# Patient Record
Sex: Female | Born: 1950 | Race: Black or African American | Hispanic: No | Marital: Single | Smoking: Current every day smoker
Health system: Southern US, Community
[De-identification: ages and names within clinical notes are randomized; demographics above are authoritative.]

## PROBLEM LIST (undated history)

## (undated) DIAGNOSIS — E785 Hyperlipidemia, unspecified: Secondary | ICD-10-CM

## (undated) DIAGNOSIS — I2510 Atherosclerotic heart disease of native coronary artery without angina pectoris: Secondary | ICD-10-CM

## (undated) DIAGNOSIS — I5022 Chronic systolic (congestive) heart failure: Secondary | ICD-10-CM

## (undated) DIAGNOSIS — I10 Essential (primary) hypertension: Secondary | ICD-10-CM

## (undated) HISTORY — DX: Essential (primary) hypertension: I10

## (undated) HISTORY — DX: Atherosclerotic heart disease of native coronary artery without angina pectoris: I25.10

## (undated) HISTORY — DX: Chronic systolic (congestive) heart failure: I50.22

## (undated) HISTORY — DX: Hyperlipidemia, unspecified: E78.5

---

## 1971-02-10 HISTORY — PX: ABDOMINAL HYSTERECTOMY: SHX81

## 2000-01-28 ENCOUNTER — Emergency Department (HOSPITAL_COMMUNITY): Admission: EM | Admit: 2000-01-28 | Discharge: 2000-01-28 | Payer: Self-pay | Admitting: Emergency Medicine

## 2000-02-13 ENCOUNTER — Emergency Department (HOSPITAL_COMMUNITY): Admission: EM | Admit: 2000-02-13 | Discharge: 2000-02-14 | Payer: Self-pay | Admitting: Emergency Medicine

## 2000-07-20 ENCOUNTER — Emergency Department (HOSPITAL_COMMUNITY): Admission: EM | Admit: 2000-07-20 | Discharge: 2000-07-20 | Payer: Self-pay | Admitting: Emergency Medicine

## 2001-01-18 ENCOUNTER — Encounter: Payer: Self-pay | Admitting: Emergency Medicine

## 2001-01-18 ENCOUNTER — Emergency Department (HOSPITAL_COMMUNITY): Admission: EM | Admit: 2001-01-18 | Discharge: 2001-01-19 | Payer: Self-pay | Admitting: Emergency Medicine

## 2004-02-15 ENCOUNTER — Emergency Department (HOSPITAL_COMMUNITY): Admission: EM | Admit: 2004-02-15 | Discharge: 2004-02-15 | Payer: Self-pay | Admitting: Family Medicine

## 2004-07-11 ENCOUNTER — Emergency Department (HOSPITAL_COMMUNITY): Admission: EM | Admit: 2004-07-11 | Discharge: 2004-07-11 | Payer: Self-pay | Admitting: Emergency Medicine

## 2012-07-08 ENCOUNTER — Emergency Department (HOSPITAL_COMMUNITY)
Admission: EM | Admit: 2012-07-08 | Discharge: 2012-07-08 | Disposition: A | Discharge status: Home / Self Care | Payer: No Typology Code available for payment source | Attending: Emergency Medicine | Admitting: Emergency Medicine

## 2012-07-08 ENCOUNTER — Encounter (HOSPITAL_COMMUNITY): Payer: Self-pay | Admitting: *Deleted

## 2012-07-08 ENCOUNTER — Emergency Department (HOSPITAL_COMMUNITY): Payer: No Typology Code available for payment source

## 2012-07-08 DIAGNOSIS — Y9229 Other specified public building as the place of occurrence of the external cause: Secondary | ICD-10-CM | POA: Insufficient documentation

## 2012-07-08 DIAGNOSIS — F17200 Nicotine dependence, unspecified, uncomplicated: Secondary | ICD-10-CM | POA: Insufficient documentation

## 2012-07-08 DIAGNOSIS — M25562 Pain in left knee: Secondary | ICD-10-CM

## 2012-07-08 DIAGNOSIS — W010XXA Fall on same level from slipping, tripping and stumbling without subsequent striking against object, initial encounter: Secondary | ICD-10-CM | POA: Insufficient documentation

## 2012-07-08 DIAGNOSIS — Y998 Other external cause status: Secondary | ICD-10-CM | POA: Insufficient documentation

## 2012-07-08 DIAGNOSIS — M25569 Pain in unspecified knee: Secondary | ICD-10-CM | POA: Insufficient documentation

## 2012-07-08 MED ORDER — HYDROCODONE-ACETAMINOPHEN 5-325 MG PO TABS: 1.0000 | ORAL_TABLET | ORAL | Status: DC | PRN

## 2012-07-08 NOTE — ED Notes (Signed)
The pt slipped in some water at a store and twisted her lt  Knee.  She was mopping the floor when she went home and felt a twinge in her lt side

## 2012-07-08 NOTE — ED Provider Notes (Signed)
History  This chart was scribed for Arnoldo Hooker, PA-C working with Dione Booze, MD by Ardelia Mems, ED Scribe. This patient was seen in room TR08C/TR08C and the patient's care was started at 8:22 PM.   CSN: [id number]  Arrival date & time 07/08/12  1845     Chief Complaint  Patient presents with  . Fall     The history is provided by the patient. No language interpreter was used.    HPI Comments: Rebecca Esparza is a 62 y.o. female who presents to the Emergency Department complaining of constant, moderate left knee pain onset last night after a fall at a store. Pt states that she slipped on some water at a store, twisted and fell onto her knee. She states that she was mopping the floor at her house afterwards when she felt a twinge in her knee. Pt is ambulatory. Pt states that she is otherwise healthy.   History reviewed. No pertinent past medical history.  History reviewed. No pertinent past surgical history.  No family history on file.  History  Substance Use Topics  . Smoking status: Current Every Day Smoker  . Smokeless tobacco: Not on file  . Alcohol Use: Yes    OB History   Grav Para Term Preterm Abortions TAB SAB Ect Mult Living                  Review of Systems  Constitutional: Negative for fever and chills.  Gastrointestinal: Negative for vomiting, abdominal pain and diarrhea.  Musculoskeletal: Negative for back pain.       See HPI.  Neurological: Negative for headaches.    Allergies  Review of patient's allergies indicates no known allergies.  Home Medications  No current outpatient prescriptions on file.  Triage Vitals: BP 135/102  Pulse 95  Temp(Src) 98.3 F (36.8 C)  Resp 20  SpO2 96%  Physical Exam  Nursing note and vitals reviewed. Constitutional: She is oriented to person, place, and time. She appears well-developed and well-nourished.  HENT:  Head: Normocephalic and atraumatic.  Eyes: EOM are normal. Pupils are equal, round,  and reactive to light.  Neck: Normal range of motion. No tracheal deviation present.  Cardiovascular: Normal rate.   Pulmonary/Chest: Effort normal. No respiratory distress.  Abdominal: Soft. There is no tenderness.  Musculoskeletal:  Minimal swelliing to the left knee. No bony deformities. Joint stable. Anterior tenderness. No calf or thigh pain. Distal pulses intact.  Neurological: She is alert and oriented to person, place, and time.  Skin: Skin is warm. No rash noted.  Psychiatric: She has a normal mood and affect.    ED Course  Procedures (including critical care time)  DIAGNOSTIC STUDIES: Oxygen Saturation is 96% on RA, normal by my interpretation.    COORDINATION OF CARE: 8:30 PM- Pt advised of plan for treatment and pt agrees.     Labs Reviewed - No data to display No results found.   No diagnosis found.  1. Knee pain   MDM  Negative x-rays and stable joint. Doubt ligamentous rupture injury. Consistent with strain injury. Knee immobilizer. Patient declines crutches.         I personally performed the services described in this documentation, which was scribed in my presence. The recorded information has been reviewed and is accurate.     Arnoldo Hooker, PA-C 07/08/12 2059

## 2012-07-09 NOTE — ED Provider Notes (Signed)
Medical screening examination/treatment/procedure(s) were performed by non-physician practitioner and as supervising physician I was immediately available for consultation/collaboration.  Dione Booze, MD 07/09/12 [phone]

## 2013-09-30 IMAGING — CR DG KNEE COMPLETE 4+V*L*
4 series · 4 of 4 positions shown · non-contrast
Comparison: None.

CLINICAL DATA: Fall, left knee pain

LEFT KNEE - COMPLETE 4+ VIEW

[t knee ap left]
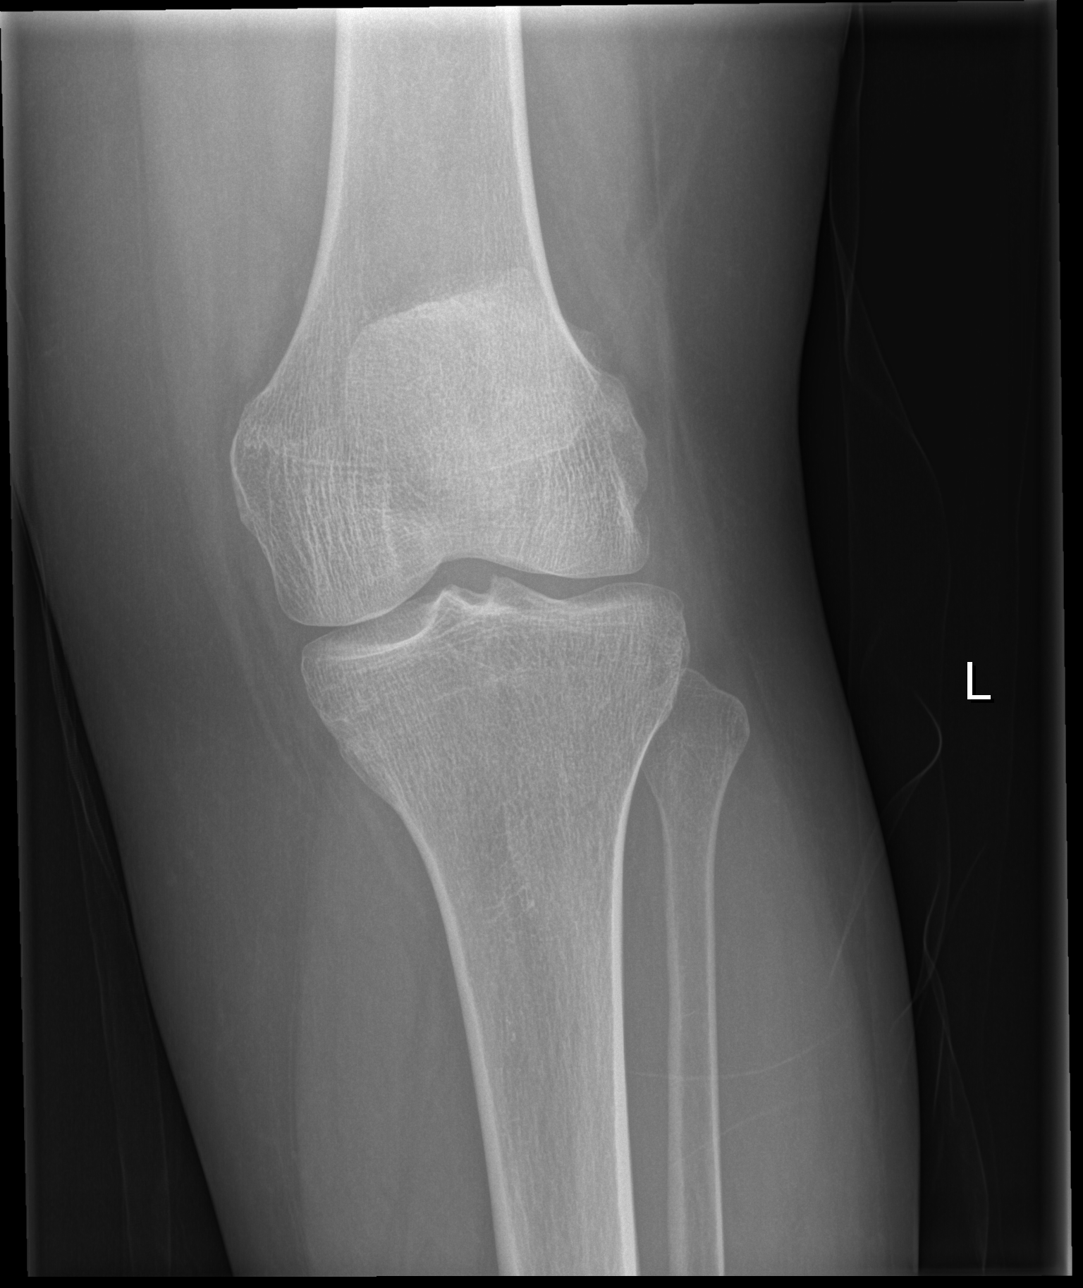

[t knee obl left (1 of 2)]
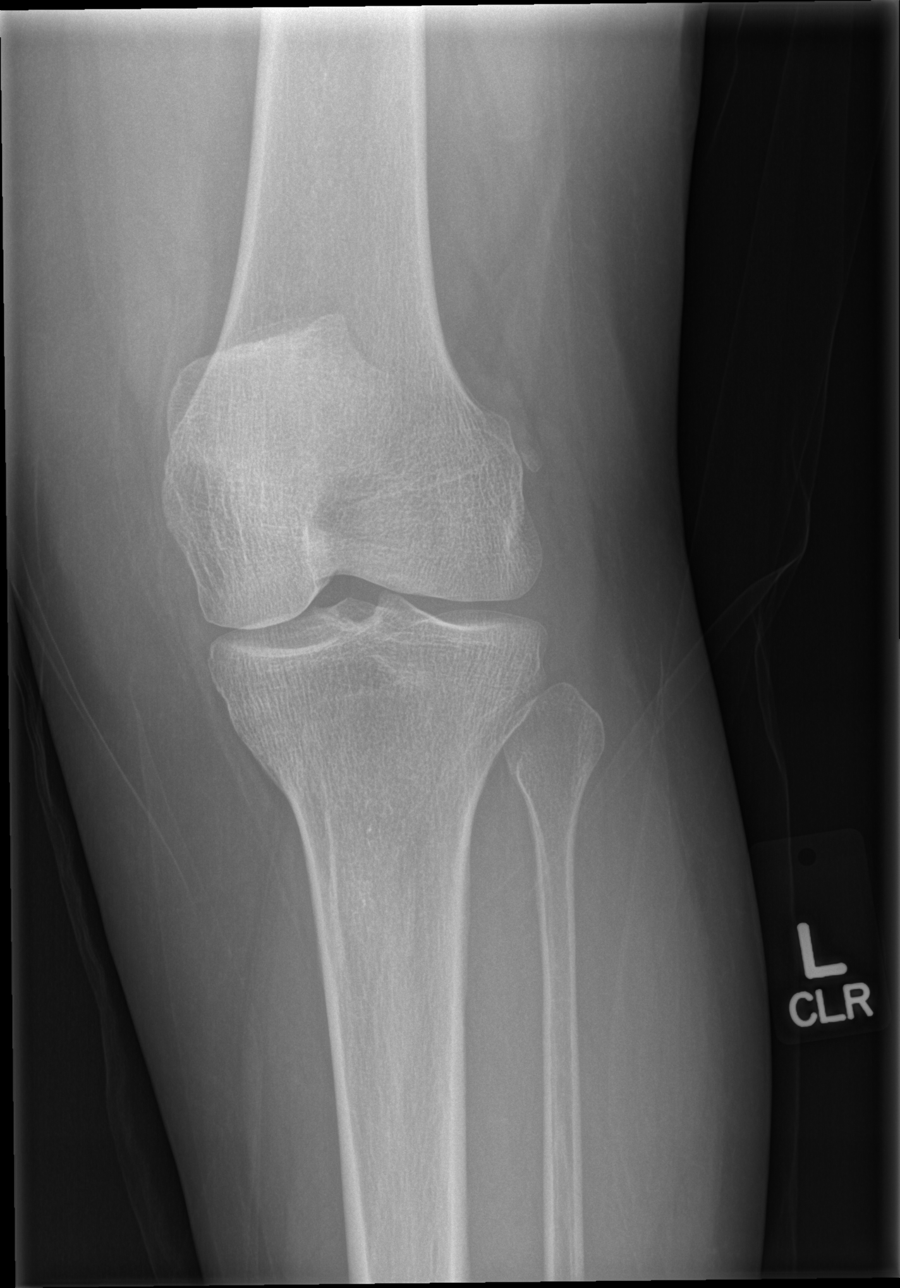

[t knee obl left (2 of 2)]
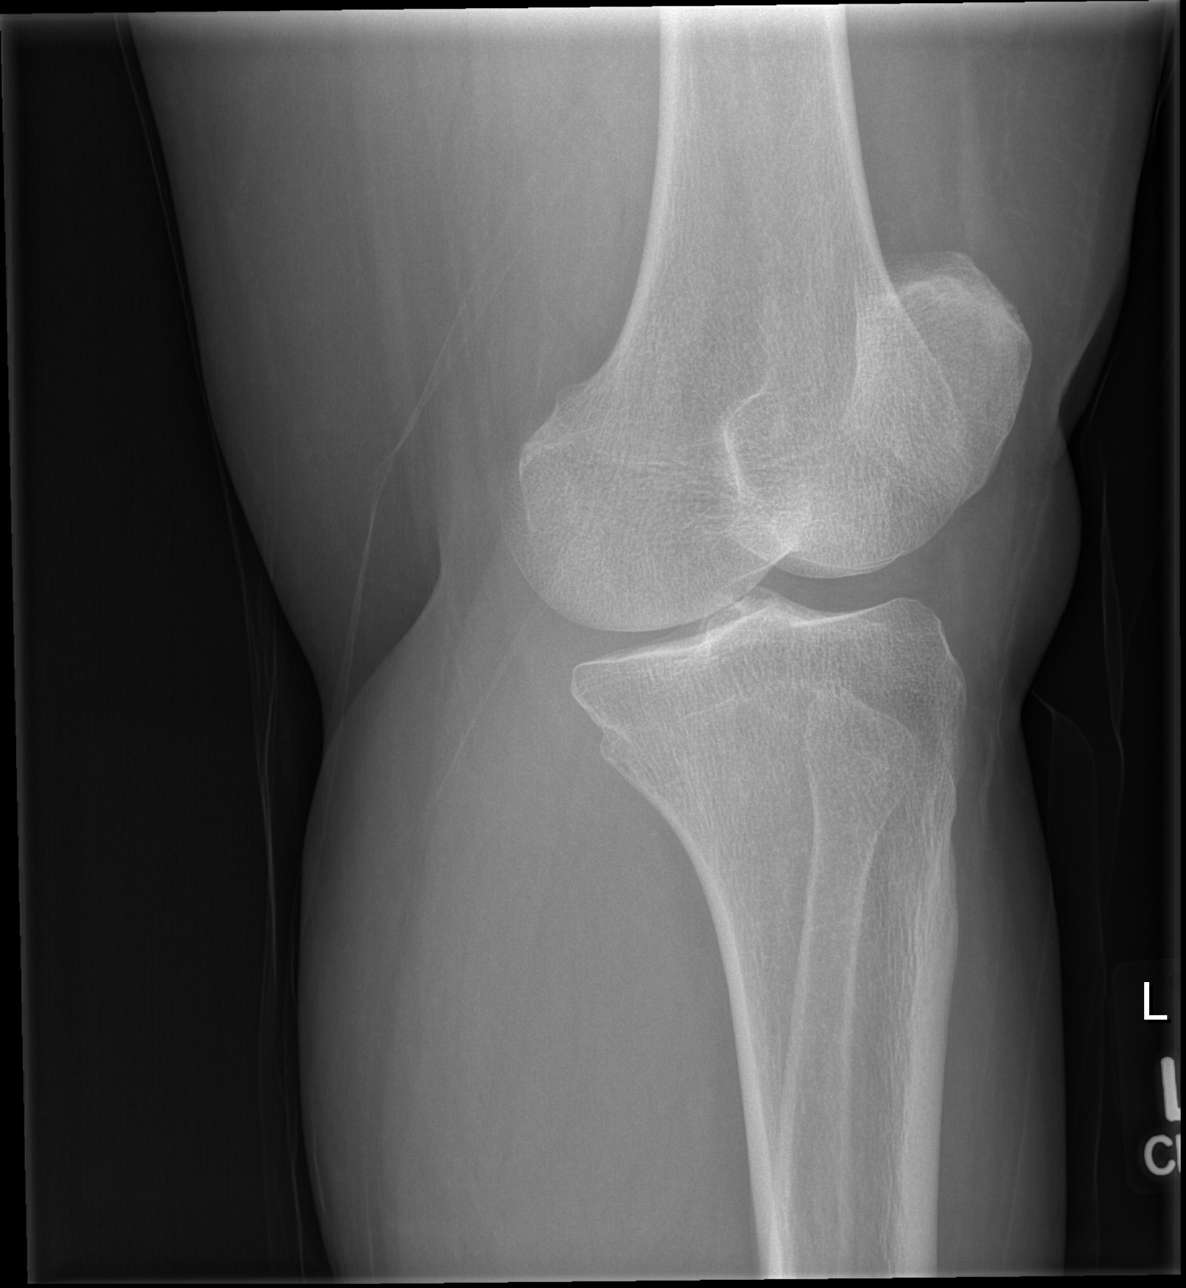

[t knee lat left]
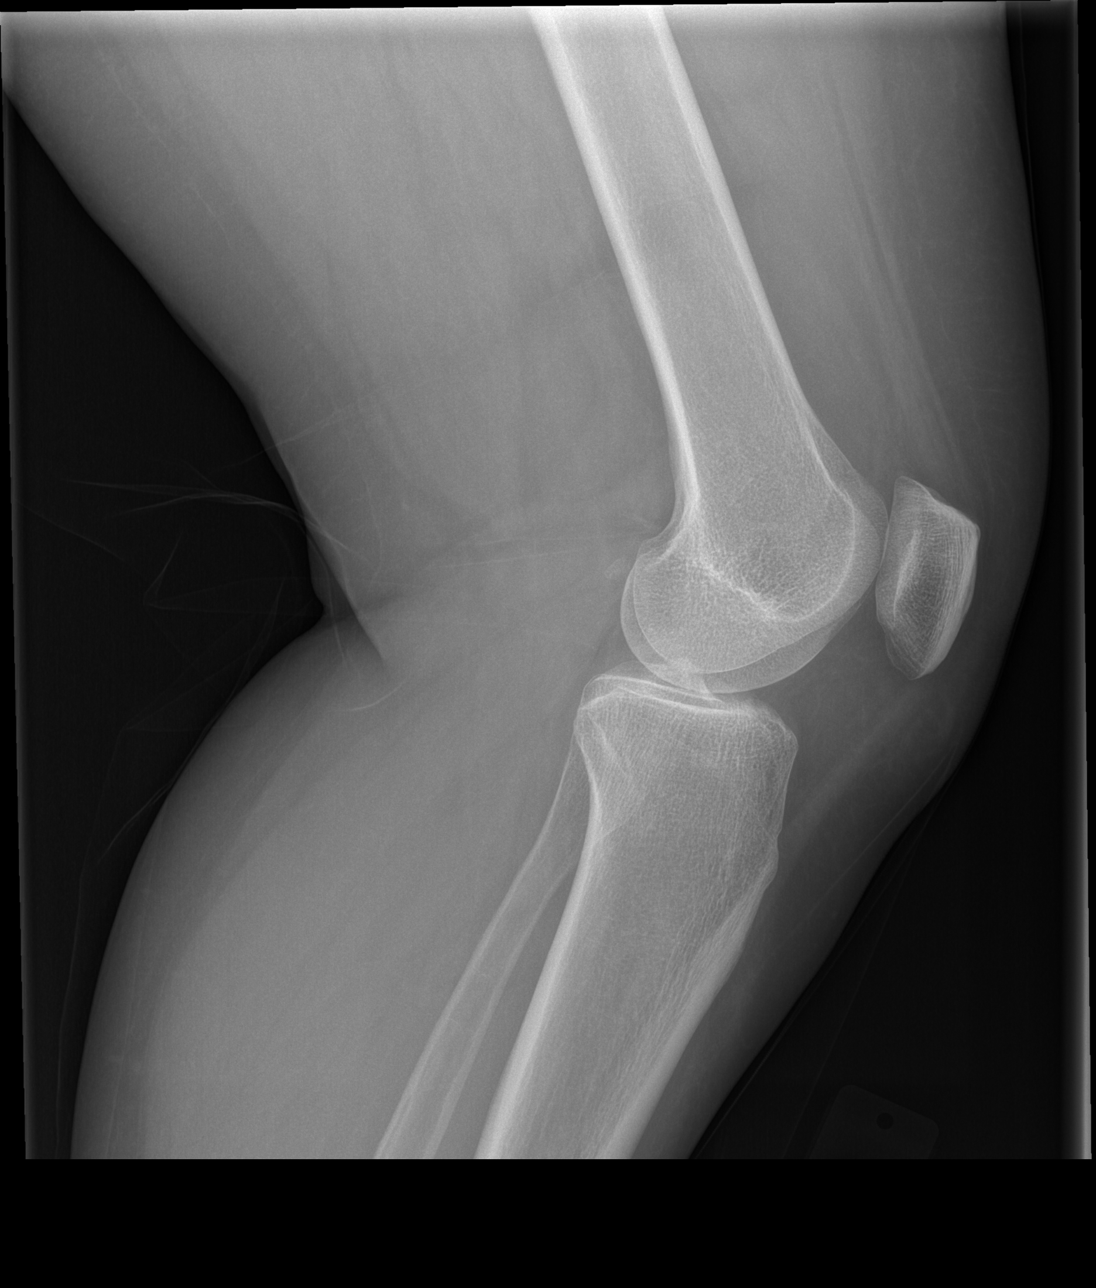

[4 of 4 positions shown; findings below may reference images not displayed]

FINDINGS: No fracture or dislocation is seen.

The joint spaces are preserved.

Moderate suprapatellar knee joint effusion.
IMPRESSION: No fracture or dislocation is seen.

Moderate suprapatellar knee joint effusion.

## 2019-05-10 ENCOUNTER — Encounter (HOSPITAL_COMMUNITY): Payer: Self-pay

## 2019-05-10 ENCOUNTER — Ambulatory Visit (HOSPITAL_COMMUNITY)
Admission: EM | Admit: 2019-05-10 | Discharge: 2019-05-10 | Disposition: A | Discharge status: Home / Self Care | Payer: Medicare Other | Attending: Family Medicine | Admitting: Family Medicine

## 2019-05-10 ENCOUNTER — Other Ambulatory Visit: Payer: Self-pay

## 2019-05-10 DIAGNOSIS — R030 Elevated blood-pressure reading, without diagnosis of hypertension: Secondary | ICD-10-CM

## 2019-05-10 DIAGNOSIS — F439 Reaction to severe stress, unspecified: Secondary | ICD-10-CM

## 2019-05-10 NOTE — ED Triage Notes (Signed)
Patient went to have her tooth pulled and the dentist send her here due to HTN. According to the dentist, Bps were as follows:  0920: 175/172, P 118 0935: 186/142, P 112 0945: 157/132, P 111

## 2019-05-10 NOTE — ED Provider Notes (Signed)
[place]   [id number] 05/10/19 Arrival Time: 4008  ASSESSMENT & PLAN:  1. Elevated blood pressure reading without diagnosis of hypertension   2. Situational stress     Declines initiation of BP therapy at this time. Recommend checking pressures and arranging f/u with PCP. May f/u here at any time to recheck BP. No s/s of hypertensive urgency.   Follow-up Information    Schedule an appointment as soon as possible for a visit  with Primary Care at [facility].   Specialty: Family Medicine Contact information: [address] [place] [phone]       Schedule an appointment as soon as possible for a visit  with [place].   Contact information: [address] [place] [phone]         See AVS for written HTN information.  Reviewed expectations re: course of current medical issues. Questions answered. Outlined signs and symptoms indicating need for more acute intervention. Patient verbalized understanding. After Visit Summary given.   SUBJECTIVE:  Rebecca Esparza is a 69 y.o. female who presents with concerns regarding increased blood pressures noted at the dentist this morning. "I'm really stressed too"; family matters; letting her brother stay with her currently. She reports that she has not been treated for hypertension in the past.  She reports no chest pain on exertion, no dyspnea on exertion, no swelling of ankles, no orthostatic dizziness or lightheadedness and no orthopnea or paroxysmal nocturnal dyspnea.  Denies symptoms of chest pain, palpations, orthopnea, nocturnal dyspnea, or LE edema.  Social History   Tobacco Use  Smoking Status Current Every Day Smoker  Smokeless Tobacco Never Used  Tobacco Comment   5-7 cigs/day    OBJECTIVE:  Vitals:   05/10/19 1027  BP: (!) 145/108  Pulse: (!) 123  Resp: 16  Temp: 99 F (37.2 C)  TempSrc:  Oral  SpO2: 98%    General appearance: alert; no distress Eyes: PERRLA; EOMI HENT: normocephalic; atraumatic Neck: supple Lungs: speaks full sentences without difficulty; unlabored CV: slight tachycardia (recheck pulse 110 and reg) Abdomen: soft, non-tender; bowel sounds normal Extremities: no edema; symmetrical with no gross deformities Skin: warm and dry Psychological: alert and cooperative; normal mood and affect    No Known Allergies  History reviewed. No pertinent past medical history.   Social History   Socioeconomic History  . Marital status: Single    Spouse name: Not on file  . Number of children: Not on file  . Years of education: Not on file  . Highest education level: Not on file  Occupational History  . Not on file  Tobacco Use  . Smoking status: Current Every Day Smoker  . Smokeless tobacco: Never Used  . Tobacco comment: 5-7 cigs/day  Substance and Sexual Activity  . Alcohol use: Not Currently  . Drug use: Not on file  . Sexual activity: Not on file  Other Topics Concern  . Not on file  Social History Narrative  . Not on file   Social Determinants of Health   Financial Resource Strain:   . Difficulty of Paying Living Expenses:   Food Insecurity:   . Worried About Charity fundraiser in the Last Year:   . Arboriculturist in the Last Year:   Transportation Needs:   . Film/video editor (Medical):   Marland Kitchen Lack of Transportation (Non-Medical):   Physical Activity:   . Days of Exercise per  Week:   . Minutes of Exercise per Session:   Stress:   . Feeling of Stress :   Social Connections:   . Frequency of Communication with Friends and Family:   . Frequency of Social Gatherings with Friends and Family:   . Attends Religious Services:   . Active Member of Clubs or Organizations:   . Attends Banker Meetings:   Marland Kitchen Marital Status:   Intimate Partner Violence:   . Fear of Current or Ex-Partner:   . Emotionally Abused:   Marland Kitchen Physically  Abused:   . Sexually Abused:    No family history on file. Past Surgical History:  Procedure Laterality Date  . ABDOMINAL HYSTERECTOMY  1973      Mardella Layman, MD 05/10/19 1058

## 2022-03-12 DIAGNOSIS — I2102 ST elevation (STEMI) myocardial infarction involving left anterior descending coronary artery: Secondary | ICD-10-CM

## 2022-03-12 HISTORY — DX: ST elevation (STEMI) myocardial infarction involving left anterior descending coronary artery: I21.02

## 2022-03-20 ENCOUNTER — Encounter (HOSPITAL_COMMUNITY): Payer: Self-pay | Admitting: Emergency Medicine

## 2022-03-20 ENCOUNTER — Encounter (HOSPITAL_COMMUNITY)
Admission: EM | Disposition: A | Discharge status: Home / Self Care | Payer: Self-pay | Source: Home / Self Care | Attending: Cardiovascular Disease

## 2022-03-20 ENCOUNTER — Other Ambulatory Visit: Payer: Self-pay

## 2022-03-20 ENCOUNTER — Emergency Department (HOSPITAL_COMMUNITY): Payer: 59

## 2022-03-20 ENCOUNTER — Inpatient Hospital Stay (HOSPITAL_COMMUNITY)
Admission: EM | Admit: 2022-03-20 | Discharge: 2022-03-28 | DRG: 270 | Disposition: A | Discharge status: Home / Self Care | Payer: 59 | Attending: Cardiovascular Disease | Admitting: Cardiovascular Disease

## 2022-03-20 DIAGNOSIS — R42 Dizziness and giddiness: Secondary | ICD-10-CM | POA: Diagnosis not present

## 2022-03-20 DIAGNOSIS — Z6838 Body mass index (BMI) 38.0-38.9, adult: Secondary | ICD-10-CM

## 2022-03-20 DIAGNOSIS — J9601 Acute respiratory failure with hypoxia: Secondary | ICD-10-CM | POA: Diagnosis not present

## 2022-03-20 DIAGNOSIS — R0689 Other abnormalities of breathing: Secondary | ICD-10-CM | POA: Diagnosis not present

## 2022-03-20 DIAGNOSIS — I5021 Acute systolic (congestive) heart failure: Secondary | ICD-10-CM | POA: Diagnosis not present

## 2022-03-20 DIAGNOSIS — Z79899 Other long term (current) drug therapy: Secondary | ICD-10-CM | POA: Diagnosis not present

## 2022-03-20 DIAGNOSIS — I509 Heart failure, unspecified: Secondary | ICD-10-CM | POA: Diagnosis not present

## 2022-03-20 DIAGNOSIS — J42 Unspecified chronic bronchitis: Secondary | ICD-10-CM | POA: Diagnosis not present

## 2022-03-20 DIAGNOSIS — I110 Hypertensive heart disease with heart failure: Secondary | ICD-10-CM | POA: Diagnosis present

## 2022-03-20 DIAGNOSIS — I2109 ST elevation (STEMI) myocardial infarction involving other coronary artery of anterior wall: Principal | ICD-10-CM | POA: Diagnosis present

## 2022-03-20 DIAGNOSIS — Z1152 Encounter for screening for COVID-19: Secondary | ICD-10-CM

## 2022-03-20 DIAGNOSIS — K047 Periapical abscess without sinus: Secondary | ICD-10-CM | POA: Diagnosis not present

## 2022-03-20 DIAGNOSIS — F17210 Nicotine dependence, cigarettes, uncomplicated: Secondary | ICD-10-CM | POA: Diagnosis present

## 2022-03-20 DIAGNOSIS — N179 Acute kidney failure, unspecified: Secondary | ICD-10-CM | POA: Diagnosis present

## 2022-03-20 DIAGNOSIS — Z95828 Presence of other vascular implants and grafts: Secondary | ICD-10-CM | POA: Diagnosis not present

## 2022-03-20 DIAGNOSIS — I213 ST elevation (STEMI) myocardial infarction of unspecified site: Principal | ICD-10-CM

## 2022-03-20 DIAGNOSIS — J984 Other disorders of lung: Secondary | ICD-10-CM | POA: Diagnosis not present

## 2022-03-20 DIAGNOSIS — Z720 Tobacco use: Secondary | ICD-10-CM | POA: Diagnosis not present

## 2022-03-20 DIAGNOSIS — Z452 Encounter for adjustment and management of vascular access device: Secondary | ICD-10-CM | POA: Diagnosis not present

## 2022-03-20 DIAGNOSIS — R053 Chronic cough: Secondary | ICD-10-CM | POA: Diagnosis present

## 2022-03-20 DIAGNOSIS — I255 Ischemic cardiomyopathy: Secondary | ICD-10-CM | POA: Diagnosis not present

## 2022-03-20 DIAGNOSIS — R000 Tachycardia, unspecified: Secondary | ICD-10-CM | POA: Diagnosis not present

## 2022-03-20 DIAGNOSIS — I2102 ST elevation (STEMI) myocardial infarction involving left anterior descending coronary artery: Secondary | ICD-10-CM | POA: Diagnosis not present

## 2022-03-20 DIAGNOSIS — I2510 Atherosclerotic heart disease of native coronary artery without angina pectoris: Secondary | ICD-10-CM | POA: Diagnosis not present

## 2022-03-20 DIAGNOSIS — R739 Hyperglycemia, unspecified: Secondary | ICD-10-CM | POA: Diagnosis present

## 2022-03-20 DIAGNOSIS — R570 Cardiogenic shock: Secondary | ICD-10-CM | POA: Diagnosis not present

## 2022-03-20 DIAGNOSIS — Z90710 Acquired absence of both cervix and uterus: Secondary | ICD-10-CM | POA: Diagnosis not present

## 2022-03-20 DIAGNOSIS — E669 Obesity, unspecified: Secondary | ICD-10-CM | POA: Diagnosis present

## 2022-03-20 DIAGNOSIS — Z955 Presence of coronary angioplasty implant and graft: Secondary | ICD-10-CM

## 2022-03-20 DIAGNOSIS — I517 Cardiomegaly: Secondary | ICD-10-CM | POA: Diagnosis not present

## 2022-03-20 DIAGNOSIS — I420 Dilated cardiomyopathy: Secondary | ICD-10-CM | POA: Diagnosis not present

## 2022-03-20 DIAGNOSIS — I700 Atherosclerosis of aorta: Secondary | ICD-10-CM | POA: Diagnosis not present

## 2022-03-20 DIAGNOSIS — R0902 Hypoxemia: Secondary | ICD-10-CM | POA: Diagnosis not present

## 2022-03-20 DIAGNOSIS — R0602 Shortness of breath: Secondary | ICD-10-CM | POA: Diagnosis not present

## 2022-03-20 DIAGNOSIS — I771 Stricture of artery: Secondary | ICD-10-CM | POA: Diagnosis not present

## 2022-03-20 HISTORY — PX: IABP INSERTION: CATH118242

## 2022-03-20 HISTORY — PX: RIGHT/LEFT HEART CATH AND CORONARY ANGIOGRAPHY: CATH118266

## 2022-03-20 HISTORY — PX: CORONARY/GRAFT ACUTE MI REVASCULARIZATION: CATH118305

## 2022-03-20 LAB — CBC WITH DIFFERENTIAL/PLATELET
Abs Immature Granulocytes: 0 10*3/uL (ref 0.00–0.07)
Basophils Absolute: 0 10*3/uL (ref 0.0–0.1)
Basophils Relative: 0 %
Eosinophils Absolute: 0.4 10*3/uL (ref 0.0–0.5)
Eosinophils Relative: 4 %
HCT: 36.5 % (ref 36.0–46.0)
Hemoglobin: 11.8 g/dL — ABNORMAL LOW (ref 12.0–15.0)
Lymphocytes Relative: 56 %
Lymphs Abs: 5.5 10*3/uL — ABNORMAL HIGH (ref 0.7–4.0)
MCH: 32.2 pg (ref 26.0–34.0)
MCHC: 32.3 g/dL (ref 30.0–36.0)
MCV: 99.7 fL (ref 80.0–100.0)
Monocytes Absolute: 0.4 10*3/uL (ref 0.1–1.0)
Monocytes Relative: 4 %
Neutro Abs: 3.6 10*3/uL (ref 1.7–7.7)
Neutrophils Relative %: 36 %
Platelets: 278 10*3/uL (ref 150–400)
RBC: 3.66 MIL/uL — ABNORMAL LOW (ref 3.87–5.11)
RDW: 13.7 % (ref 11.5–15.5)
WBC: 9.9 10*3/uL (ref 4.0–10.5)
nRBC: 0 % (ref 0.0–0.2)
nRBC: 0 /100 WBC

## 2022-03-20 LAB — COMPREHENSIVE METABOLIC PANEL
ALT: 35 U/L (ref 0–44)
AST: 53 U/L — ABNORMAL HIGH (ref 15–41)
Albumin: 3.3 g/dL — ABNORMAL LOW (ref 3.5–5.0)
Alkaline Phosphatase: 89 U/L (ref 38–126)
Anion gap: 16 — ABNORMAL HIGH (ref 5–15)
BUN: 12 mg/dL (ref 8–23)
CO2: 15 mmol/L — ABNORMAL LOW (ref 22–32)
Calcium: 8.6 mg/dL — ABNORMAL LOW (ref 8.9–10.3)
Chloride: 105 mmol/L (ref 98–111)
Creatinine, Ser: 1.48 mg/dL — ABNORMAL HIGH (ref 0.44–1.00)
GFR, Estimated: 38 mL/min — ABNORMAL LOW (ref >60)
Glucose, Bld: 475 mg/dL — ABNORMAL HIGH (ref 70–99)
Potassium: 4.1 mmol/L (ref 3.5–5.1)
Sodium: 136 mmol/L (ref 135–145)
Total Bilirubin: 1 mg/dL (ref 0.3–1.2)
Total Protein: 6.6 g/dL (ref 6.5–8.1)

## 2022-03-20 LAB — POCT I-STAT EG7
Acid-base deficit: 2 mmol/L (ref 0.0–2.0)
Bicarbonate: 25 mmol/L (ref 20.0–28.0)
Calcium, Ion: 1.22 mmol/L (ref 1.15–1.40)
HCT: 35 % — ABNORMAL LOW (ref 36.0–46.0)
Hemoglobin: 11.9 g/dL — ABNORMAL LOW (ref 12.0–15.0)
O2 Saturation: 63 %
Potassium: 3.8 mmol/L (ref 3.5–5.1)
Sodium: 141 mmol/L (ref 135–145)
TCO2: 27 mmol/L (ref 22–32)
pCO2, Ven: 50.8 mmHg (ref 44–60)
pH, Ven: 7.301 (ref 7.25–7.43)
pO2, Ven: 37 mmHg (ref 32–45)

## 2022-03-20 LAB — POCT ACTIVATED CLOTTING TIME
Activated Clotting Time: 255 seconds
Activated Clotting Time: 390 seconds
Activated Clotting Time: 287 seconds

## 2022-03-20 LAB — BRAIN NATRIURETIC PEPTIDE: B Natriuretic Peptide: 2318.7 pg/mL — ABNORMAL HIGH (ref 0.0–100.0)

## 2022-03-20 LAB — TROPONIN I (HIGH SENSITIVITY): Troponin I (High Sensitivity): 1090 ng/L (ref <18)

## 2022-03-20 LAB — GLUCOSE, CAPILLARY: Glucose-Capillary: 139 mg/dL — ABNORMAL HIGH (ref 70–99)

## 2022-03-20 SURGERY — CORONARY/GRAFT ACUTE MI REVASCULARIZATION
Anesthesia: LOCAL

## 2022-03-20 MED ORDER — TICAGRELOR 90 MG PO TABS
ORAL_TABLET | ORAL | Status: AC
  Filled 2022-03-20: qty 2

## 2022-03-20 MED ORDER — FENTANYL CITRATE PF 50 MCG/ML IJ SOSY
50.0000 ug | PREFILLED_SYRINGE | Freq: Once | INTRAMUSCULAR | Status: AC
  Administered 2022-03-20: 50 ug via INTRAVENOUS
  Filled 2022-03-20: qty 1

## 2022-03-20 MED ORDER — LIDOCAINE HCL (PF) 1 % IJ SOLN
INTRAMUSCULAR | Status: DC | PRN
  Administered 2022-03-20 (×2): 2 mL
  Administered 2022-03-20: 10 mL

## 2022-03-20 MED ORDER — HEPARIN SODIUM (PORCINE) 1000 UNIT/ML IJ SOLN
INTRAMUSCULAR | Status: AC
  Filled 2022-03-20: qty 10

## 2022-03-20 MED ORDER — HEPARIN SODIUM (PORCINE) 1000 UNIT/ML IJ SOLN
INTRAMUSCULAR | Status: DC | PRN
  Administered 2022-03-20: 3000 [IU] via INTRAVENOUS
  Administered 2022-03-20: 5000 [IU] via INTRAVENOUS

## 2022-03-20 MED ORDER — LIDOCAINE HCL (PF) 1 % IJ SOLN
INTRAMUSCULAR | Status: AC
  Filled 2022-03-20: qty 30

## 2022-03-20 MED ORDER — FUROSEMIDE 10 MG/ML IJ SOLN
40.0000 mg | Freq: Two times a day (BID) | INTRAMUSCULAR | Status: DC
  Administered 2022-03-21: 40 mg via INTRAVENOUS
  Filled 2022-03-20: qty 4

## 2022-03-20 MED ORDER — HEPARIN (PORCINE) IN NACL 1000-0.9 UT/500ML-% IV SOLN
INTRAVENOUS | Status: AC
  Filled 2022-03-20: qty 500

## 2022-03-20 MED ORDER — ONDANSETRON HCL 4 MG/2ML IJ SOLN
4.0000 mg | Freq: Four times a day (QID) | INTRAMUSCULAR | Status: DC | PRN
  Administered 2022-03-22: 4 mg via INTRAVENOUS
  Filled 2022-03-20: qty 2

## 2022-03-20 MED ORDER — IOHEXOL 350 MG/ML SOLN
INTRAVENOUS | Status: DC | PRN
  Administered 2022-03-20: 150 mL

## 2022-03-20 MED ORDER — ASPIRIN 81 MG PO TBEC
81.0000 mg | DELAYED_RELEASE_TABLET | Freq: Every day | ORAL | Status: DC
  Administered 2022-03-21 – 2022-03-28 (×8): 81 mg via ORAL
  Filled 2022-03-20 (×8): qty 1

## 2022-03-20 MED ORDER — FENTANYL CITRATE (PF) 100 MCG/2ML IJ SOLN
INTRAMUSCULAR | Status: AC
  Filled 2022-03-20: qty 2

## 2022-03-20 MED ORDER — SODIUM CHLORIDE 0.9 % IV SOLN
INTRAVENOUS | Status: AC | PRN
  Administered 2022-03-20: 10 mL/h via INTRAVENOUS

## 2022-03-20 MED ORDER — ATORVASTATIN CALCIUM 80 MG PO TABS
80.0000 mg | ORAL_TABLET | Freq: Every day | ORAL | Status: DC
  Administered 2022-03-21 – 2022-03-28 (×8): 80 mg via ORAL
  Filled 2022-03-20 (×8): qty 1

## 2022-03-20 MED ORDER — FUROSEMIDE 10 MG/ML IJ SOLN
60.0000 mg | Freq: Once | INTRAMUSCULAR | Status: AC
  Administered 2022-03-20: 60 mg via INTRAVENOUS
  Filled 2022-03-20: qty 6

## 2022-03-20 MED ORDER — NITROGLYCERIN 1 MG/10 ML FOR IR/CATH LAB
INTRA_ARTERIAL | Status: AC
  Filled 2022-03-20: qty 10

## 2022-03-20 MED ORDER — ACETAMINOPHEN 325 MG PO TABS
650.0000 mg | ORAL_TABLET | ORAL | Status: DC | PRN
  Administered 2022-03-21: 650 mg via ORAL
  Filled 2022-03-20: qty 2

## 2022-03-20 MED ORDER — SODIUM CHLORIDE 0.9 % IV SOLN: INTRAVENOUS | Status: DC

## 2022-03-20 MED ORDER — SODIUM CHLORIDE 0.9% IV SOLUTION: INTRAVENOUS | Status: DC

## 2022-03-20 MED ORDER — ASPIRIN 81 MG PO CHEW
243.0000 mg | CHEWABLE_TABLET | Freq: Once | ORAL | Status: AC
  Administered 2022-03-20: 243 mg via ORAL

## 2022-03-20 MED ORDER — HEPARIN (PORCINE) IN NACL 1000-0.9 UT/500ML-% IV SOLN
INTRAVENOUS | Status: DC | PRN
  Administered 2022-03-20 (×3): 500 mL

## 2022-03-20 MED ORDER — TICAGRELOR 90 MG PO TABS
ORAL_TABLET | ORAL | Status: DC | PRN
  Administered 2022-03-20: 180 mg via ORAL

## 2022-03-20 MED ORDER — HEPARIN (PORCINE) IN NACL 1000-0.9 UT/500ML-% IV SOLN
INTRAVENOUS | Status: AC
  Filled 2022-03-20: qty 1000

## 2022-03-20 MED ORDER — SODIUM CHLORIDE 0.9 % IV SOLN: 250.0000 mL | INTRAVENOUS | Status: DC | PRN

## 2022-03-20 MED ORDER — VERAPAMIL HCL 2.5 MG/ML IV SOLN
INTRAVENOUS | Status: AC
  Filled 2022-03-20: qty 2

## 2022-03-20 MED ORDER — NITROGLYCERIN 1 MG/10 ML FOR IR/CATH LAB
INTRA_ARTERIAL | Status: DC | PRN
  Administered 2022-03-20: 100 ug via INTRACORONARY

## 2022-03-20 MED ORDER — MIDAZOLAM HCL 2 MG/2ML IJ SOLN
INTRAMUSCULAR | Status: DC | PRN
  Administered 2022-03-20 (×2): 1 mg via INTRAVENOUS

## 2022-03-20 MED ORDER — HYDRALAZINE HCL 20 MG/ML IJ SOLN: 10.0000 mg | INTRAMUSCULAR | Status: AC | PRN

## 2022-03-20 MED ORDER — HEPARIN SODIUM (PORCINE) 5000 UNIT/ML IJ SOLN
4000.0000 [IU] | Freq: Once | INTRAMUSCULAR | Status: AC
  Administered 2022-03-20: 4000 [IU] via INTRAVENOUS

## 2022-03-20 MED ORDER — SODIUM CHLORIDE 0.9% FLUSH
3.0000 mL | Freq: Two times a day (BID) | INTRAVENOUS | Status: DC
  Administered 2022-03-21 – 2022-03-27 (×13): 3 mL via INTRAVENOUS

## 2022-03-20 MED ORDER — NITROGLYCERIN IN D5W 200-5 MCG/ML-% IV SOLN
0.0000 ug/min | INTRAVENOUS | Status: DC
  Administered 2022-03-20: 5 ug/min via INTRAVENOUS
  Filled 2022-03-20: qty 250

## 2022-03-20 MED ORDER — SODIUM CHLORIDE 0.9 % IV SOLN
INTRAVENOUS | Status: DC
  Administered 2022-03-20: 10 mL via INTRAVENOUS

## 2022-03-20 MED ORDER — SODIUM CHLORIDE 0.9% FLUSH: 3.0000 mL | INTRAVENOUS | Status: DC | PRN

## 2022-03-20 MED ORDER — CHLORHEXIDINE GLUCONATE CLOTH 2 % EX PADS
6.0000 | MEDICATED_PAD | Freq: Every day | CUTANEOUS | Status: DC
  Administered 2022-03-21 – 2022-03-24 (×4): 6 via TOPICAL

## 2022-03-20 MED ORDER — NITROGLYCERIN 0.4 MG SL SUBL: 0.4000 mg | SUBLINGUAL_TABLET | SUBLINGUAL | Status: DC | PRN

## 2022-03-20 MED ORDER — TICAGRELOR 90 MG PO TABS
90.0000 mg | ORAL_TABLET | Freq: Two times a day (BID) | ORAL | Status: DC
  Administered 2022-03-21 – 2022-03-28 (×15): 90 mg via ORAL
  Filled 2022-03-20 (×15): qty 1

## 2022-03-20 MED ORDER — VERAPAMIL HCL 2.5 MG/ML IV SOLN
INTRAVENOUS | Status: DC | PRN
  Administered 2022-03-20: 10 mL via INTRA_ARTERIAL

## 2022-03-20 MED ORDER — ASPIRIN 81 MG PO CHEW
CHEWABLE_TABLET | ORAL | Status: AC
  Filled 2022-03-20: qty 4

## 2022-03-20 MED ORDER — SODIUM CHLORIDE 0.9% IV SOLUTION: INTRAVENOUS | Status: DC | PRN

## 2022-03-20 MED ORDER — MIDAZOLAM HCL 2 MG/2ML IJ SOLN
INTRAMUSCULAR | Status: AC
  Filled 2022-03-20: qty 2

## 2022-03-20 MED ORDER — LABETALOL HCL 5 MG/ML IV SOLN: 10.0000 mg | INTRAVENOUS | Status: AC | PRN

## 2022-03-20 SURGICAL SUPPLY — 30 items
BALL SAPPHIRE NC24 4.5X8 (BALLOONS) ×1
BALLN EMERGE MR 2.0X20 (BALLOONS) ×1
BALLN EMERGE MR 3.0X12 (BALLOONS) ×1
BALLN SAPPHIRE 2.0X12 (BALLOONS) ×1
BALLOON EMERGE MR 2.0X20 (BALLOONS) IMPLANT
BALLOON EMERGE MR 3.0X12 (BALLOONS) IMPLANT
BALLOON SAPPHIRE 2.0X12 (BALLOONS) IMPLANT
BALLOON SAPPHIRE NC24 4.5X8 (BALLOONS) IMPLANT
CATH 5FR JL3.5 JR4 ANG PIG MP (CATHETERS) IMPLANT
CATH IAB 7FR 34ML (CATHETERS) IMPLANT
CATH LAUNCHER 6FR EBU3.5 (CATHETERS) IMPLANT
CATH SWAN GANZ VIP 7.5F (CATHETERS) IMPLANT
DEVICE RAD COMP TR BAND LRG (VASCULAR PRODUCTS) IMPLANT
ELECT DEFIB PAD ADLT CADENCE (PAD) IMPLANT
GLIDESHEATH SLEND SS 6F .021 (SHEATH) IMPLANT
GUIDEWIRE INQWIRE 1.5J.035X260 (WIRE) IMPLANT
INQWIRE 1.5J .035X260CM (WIRE) ×1
KIT ENCORE 26 ADVANTAGE (KITS) IMPLANT
KIT HEART LEFT (KITS) ×1 IMPLANT
KIT HEMO VALVE WATCHDOG (MISCELLANEOUS) IMPLANT
KIT MICROPUNCTURE NIT STIFF (SHEATH) IMPLANT
PACK CARDIAC CATHETERIZATION (CUSTOM PROCEDURE TRAY) ×1 IMPLANT
SHEATH PINNACLE 8F 10CM (SHEATH) IMPLANT
SHEATH PROBE COVER 6X72 (BAG) IMPLANT
SLEEVE REPOSITIONING LENGTH 30 (MISCELLANEOUS) IMPLANT
STENT MEGATRON 4.0X12 (Permanent Stent) IMPLANT
TRANSDUCER W/STOPCOCK (MISCELLANEOUS) ×1 IMPLANT
TUBING CIL FLEX 10 FLL-RA (TUBING) ×1 IMPLANT
WIRE COUGAR XT STRL 190CM (WIRE) IMPLANT
WIRE EMERALD 3MM-J .035X150CM (WIRE) IMPLANT

## 2022-03-20 NOTE — ED Notes (Signed)
MD Branham at bedside, verbal order for 324 mg asa.  Given per order.

## 2022-03-20 NOTE — ED Notes (Signed)
MD Nechama Guard notified of critical trop.

## 2022-03-20 NOTE — Progress Notes (Incomplete)
ANTICOAGULATION CONSULT NOTE - Initial Consult  Pharmacy Consult for Heparin Indication:  IABP  No Known Allergies  Patient Measurements: Height: 5' 3"$  (160 cm) Weight: 117.9 kg (260 lb) (pt states somewhere between 250-270#) IBW/kg (Calculated) : 52.4 Heparin Dosing Weight: 81 kg  Vital Signs: Temp: 96.5 F (35.8 C) (02/09 2031) Temp Source: Axillary (02/09 2031) BP: 134/101 (02/09 2115) Pulse Rate: 110 (02/09 2115)  Labs: Recent Labs    03/20/22 1955  HGB 11.8*  HCT 36.5  PLT 278  CREATININE 1.48*  TROPONINIHS 1,090*    Estimated Creatinine Clearance: 43.9 mL/min (A) (by C-G formula based on SCr of 1.48 mg/dL (H)).   Medical History: History reviewed. No pertinent past medical history.  Medications:  Awaiting home med rec  Assessment: 72 y.o. presented as Code STEMI - taken to cath lab - s/p PCI. IABP placed. CBC ok on admission. No AC PTA.  Goal of Therapy:  Heparin level 0.2-0.5 units/ml Monitor platelets by anticoagulation protocol: Yes   Plan:  Heparin 1000 units/hr F/u 8 hr heparin level  Sherlon Handing, PharmD, BCPS Please see amion for complete clinical pharmacist phone list 03/20/2022,10:20 PM

## 2022-03-20 NOTE — ED Notes (Signed)
Respiratory at bedside.

## 2022-03-20 NOTE — Progress Notes (Signed)
Pt taken off Bipap and placed on NRB per MD.

## 2022-03-20 NOTE — ED Provider Notes (Signed)
[facility] Provider Note   CSN: [id number] Arrival date & time: 03/20/22  1934     History  Chief Complaint  Patient presents with   Shortness of Breath    Rebecca Esparza is a 72 y.o. female.  With PMH of HTN, obesity brought in by EMS for respiratory distress.  She was satting 83% on room air given a DuoNeb.  Started on nonrebreather. Later complaining of chest tightness radiating to jaw associated with vomiting and shortness of breath.  Patient unable to provide any history upon arrival due to respiratory distress.  Once she was calm down on BiPAP she was able to provide further history.  She said earlier today she developed sudden onset chest tightness radiating to her jaw which felt pressure-like in nature associate with acute shortness of breath.  She did have 1 episode of nonbloody nonbilious emesis with this episode.  She has had no fevers, congestion, rhinorrhea, sore throat.  She is complaining of a cough productive of frothy clear sputum.  She has had no leg pain or swelling.  She denies any history of blood clots or MI.  No history of heart failure reported.  She is a smoker.  Denies any history of COPD or asthma.  She is feeling better on the BiPAP but still complaining of chest pain and pressure.   Shortness of Breath      Home Medications Prior to Admission medications   Medication Sig Start Date End Date Taking? Authorizing Provider  HYDROcodone-acetaminophen (NORCO/VICODIN) 5-325 MG per tablet Take 1-2 tablets by mouth every 4 (four) hours as needed for pain. 07/08/12   Charlann Lange, PA-C      Allergies    Patient has no known allergies.    Review of Systems   Review of Systems  Respiratory:  Positive for shortness of breath.     Physical Exam Updated Vital Signs BP 114/87   Pulse (!) 110   Temp (!) 96.5 F (35.8 C) (Axillary)   Resp (!) 21   Ht 5' 3"$  (1.6 m)   Wt 117.9 kg Comment: pt states somewhere  between 250-270#  SpO2 97%   BMI 46.06 kg/m  Physical Exam Constitutional: Alert and oriented.  Ill-appearing and agitated eyes: Conjunctivae are normal. ENT      Head: Normocephalic and atraumatic. Cardiovascular: S1, S2, tachycardic, normal and symmetric distal pulses are present in all extremities.Warm and well perfused. Respiratory: Tachypneic, bilateral diffuse Rales, O2 sat 98 on nonrebreather. Gastrointestinal: Soft and nontender.  Musculoskeletal: Normal range of motion in all extremities. No pitting edema of lower extremities Neurologic: Normal speech and language. No gross focal neurologic deficits are appreciated. Skin: Skin is warm, dry and intact. No rash noted. Psychiatric: Anxious  ED Results / Procedures / Treatments   Labs (all labs ordered are listed, but only abnormal results are displayed) Labs Reviewed  BRAIN NATRIURETIC PEPTIDE - Abnormal; Notable for the following components:      Result Value   B Natriuretic Peptide 2,318.7 (*)    All other components within normal limits  CBC WITH DIFFERENTIAL/PLATELET - Abnormal; Notable for the following components:   RBC 3.66 (*)    Hemoglobin 11.8 (*)    Lymphs Abs 5.5 (*)    All other components within normal limits  COMPREHENSIVE METABOLIC PANEL - Abnormal; Notable for the following components:   CO2 15 (*)    Glucose, Bld 475 (*)    Creatinine, Ser 1.48 (*)  Calcium 8.6 (*)    Albumin 3.3 (*)    AST 53 (*)    GFR, Estimated 38 (*)    Anion gap 16 (*)    All other components within normal limits  TROPONIN I (HIGH SENSITIVITY) - Abnormal; Notable for the following components:   Troponin I (High Sensitivity) 1,090 (*)    All other components within normal limits  RESP PANEL BY RT-PCR (RSV, FLU A&B, COVID)  RVPGX2  TROPONIN I (HIGH SENSITIVITY)    EKG EKG Interpretation  Date/Time:  Friday March 20 2022 19:42:59 EST Ventricular Rate:  141 PR Interval:  137 QRS Duration: 80 QT Interval:  275 QTC  Calculation: 422 R Axis:   -22 Text Interpretation: Sinus tachycardia LVH by voltage Anterior Q waves, possibly due to LVH Repolarization abnormality, prob rate related Borderline ST elevation, lateral leads Anterolateral ST elevations with inferior reciprocal depressions Confirmed by Georgina Snell [phone]) on 03/20/2022 9:15:43 PM  Radiology DG Chest Portable 1 View  Result Date: 03/20/2022 CLINICAL DATA:  Concern for pulmonary edema. EXAM: PORTABLE CHEST 1 VIEW COMPARISON:  None Available. FINDINGS: There is diffuse chronic interstitial coarsening and bronchitic changes. Cardiomegaly with mild central vascular congestion. Bilateral mid to lower lung field interstitial densities may represent edema, or pneumonia. No large pleural effusion. No obvious pneumothorax. Evaluation however is limited due to patient's positioning and superimposition of the jaw over the upper lung. No definite acute osseous pathology. IMPRESSION: 1. Cardiomegaly with mild central vascular congestion. 2. Bilateral mid to lower lung field interstitial densities may represent edema, or pneumonia. Electronically Signed   By: Anner Crete M.D.   On: 03/20/2022 20:09    Procedures .Critical Care  Performed by: Elgie Congo, MD Authorized by: Elgie Congo, MD   Critical care provider statement:    Critical care time (minutes):  74   Critical care was necessary to treat or prevent imminent or life-threatening deterioration of the following conditions:  Cardiac failure and respiratory failure   Critical care was time spent personally by me on the following activities:  Development of treatment plan with patient or surrogate, discussions with consultants, evaluation of patient's response to treatment, examination of patient, ordering and review of laboratory studies, ordering and review of radiographic studies, ordering and performing treatments and interventions, pulse oximetry, re-evaluation of patient's  condition, review of old charts and obtaining history from patient or surrogate   Care discussed with: admitting provider      Medications Ordered in ED Medications  nitroGLYCERIN 50 mg in dextrose 5 % 250 mL (0.2 mg/mL) infusion (0 mcg/min Intravenous Stopped 03/20/22 2154)  0.9 %  sodium chloride infusion (10 mLs Intravenous New Bag/Given 03/20/22 2104)  lidocaine (PF) (XYLOCAINE) 1 % injection (2 mLs  Given 03/20/22 2307)  Radial Cocktail/Verapamil only (10 mLs Intra-arterial Given 03/20/22 2152)  Heparin (Porcine) in NaCl 1000-0.9 UT/500ML-% SOLN (500 mLs  Given 03/20/22 2152)  midazolam (VERSED) injection (1 mg Intravenous Given 03/20/22 2223)  0.9 %  sodium chloride infusion (10 mL/hr Intravenous New Bag/Given 03/20/22 2206)  heparin sodium (porcine) injection (3,000 Units Intravenous Given 03/20/22 2210)  ticagrelor (BRILINTA) tablet (180 mg Oral Given 03/20/22 2252)  nitroGLYCERIN 1 mg/10 mL (100 mcg/mL) - IR/CATH LAB (100 mcg Intracoronary Given 03/20/22 2252)  aspirin 81 MG chewable tablet (  Given 03/20/22 1950)  aspirin chewable tablet 243 mg (243 mg Oral Given 03/20/22 2052)  furosemide (LASIX) injection 60 mg (60 mg Intravenous Given 03/20/22 2101)  heparin injection 4,000 Units (  4,000 Units Intravenous Given 03/20/22 2058)  fentaNYL (SUBLIMAZE) injection 50 mcg (50 mcg Intravenous Given 03/20/22 2108)    ED Course/ Medical Decision Making/ A&P Clinical Course as of 03/20/22 2330  Fri Mar 20, 2022  2010 Paged STEMI doctor Burt Knack [VB]  2046 Code STEMI called.  Spoke with Dr. Burt Knack.  Plan to take patient to Cath Lab.  Heparin bolus ordered, Lasix injection ordered.  She has received full 324 chewable aspirin.  She is on nitro infusion and BiPAP currently.  We will have to reassess if patient can tolerate laying flat for Cath Lab. [VB]  2115 Patient able to tolerate laying flat on back.  Holding off from intubation.  She can go to Cath Lab on BiPAP. [VB]    Clinical Course User Index [VB] Elgie Congo, MD                             Medical Decision Making  Marshia Bergan is a 72 y.o. female.  With PMH of HTN, obesity brought in by EMS for respiratory distress.  She was satting 83% on room air given a DuoNeb.  Started on nonrebreather. Later complaining of chest tightness radiating to jaw associated with vomiting and shortness of breath.  Patient presented hypertensive and tachycardic with respiratory distress satting 100% on nonrebreather but with diffuse Rales.  Performed bedside ultrasound concerning for bilateral diffuse B-lines concerning for acute heart failure and flash pulmonary edema.  Chest x-ray confirmed with bilateral diffuse infiltrates concerning for pulmonary edema.  Patient stabilized on BiPAP and started on nitroglycerin drip..  EKG obtained concerning for STEMI with anterolateral ST elevations and reciprocal inferior depressions.  Attempted calling STEMI doctor prior to calling code STEMI but once obtaining history from patient once stabilized on BiPAP with story concerning for STEMI, code STEMI called.  Discussed with Dr. Burt Knack of cardiology STEMI doctor, patient given IV Lasix 60 mg, p.o. aspirin 324 mg, heparin bolus.   Labs obtained and concerning for troponin of 1090.  Creatinine 1.48.  Glucose 475.  Trial patient laying flat, she was able to tolerate on BiPAP.  Plan for patient to go to Cath Lab on BiPAP and admitted to cardiology for further workup and management.  Amount and/or Complexity of Data Reviewed Labs: ordered. Radiology: ordered.  Risk OTC drugs. Prescription drug management. Decision regarding hospitalization.    Final Clinical Impression(s) / ED Diagnoses Final diagnoses:  ST elevation myocardial infarction (STEMI), unspecified artery (HCC)  Acute on chronic congestive heart failure, unspecified heart failure type [facility])    Rx / DC Orders ED Discharge Orders     None         Elgie Congo, MD 03/20/22 2330

## 2022-03-20 NOTE — Progress Notes (Signed)
   03/20/22 2030  Spiritual Encounters  Type of Visit Initial  Care provided to: Patient  Conversation partners present during encounter Nurse  Referral source Code page  Reason for visit Code  OnCall Visit Yes  Spiritual Framework  Presenting Themes Impactful experiences and emotions  Patient Stress Factors Health changes  Interventions  Spiritual Care Interventions Made Compassionate presence;Established relationship of care and support;Prayer  Intervention Outcomes  Outcomes Connection to spiritual care;Reduced isolation   Pt was alert, on Bipap, she asked for prayer, which I provided.

## 2022-03-20 NOTE — ED Triage Notes (Signed)
Patient BIB [facility] c/o shob x 4 days, worsened at 1100 today.  EMS reports sats were 83% while getting 1 duoneb.  Patient currently on NRB.  Patient denies history of COPD/Asthma.

## 2022-03-21 ENCOUNTER — Inpatient Hospital Stay (HOSPITAL_COMMUNITY): Payer: 59

## 2022-03-21 ENCOUNTER — Other Ambulatory Visit (HOSPITAL_COMMUNITY): Payer: 59

## 2022-03-21 DIAGNOSIS — I5021 Acute systolic (congestive) heart failure: Secondary | ICD-10-CM

## 2022-03-21 DIAGNOSIS — R570 Cardiogenic shock: Secondary | ICD-10-CM

## 2022-03-21 DIAGNOSIS — I2510 Atherosclerotic heart disease of native coronary artery without angina pectoris: Secondary | ICD-10-CM

## 2022-03-21 DIAGNOSIS — I2102 ST elevation (STEMI) myocardial infarction involving left anterior descending coronary artery: Secondary | ICD-10-CM | POA: Diagnosis not present

## 2022-03-21 DIAGNOSIS — Z720 Tobacco use: Secondary | ICD-10-CM

## 2022-03-21 DIAGNOSIS — I213 ST elevation (STEMI) myocardial infarction of unspecified site: Secondary | ICD-10-CM

## 2022-03-21 LAB — COOXEMETRY PANEL
Carboxyhemoglobin: 0.8 % (ref 0.5–1.5)
Carboxyhemoglobin: 0.8 % (ref 0.5–1.5)
Carboxyhemoglobin: 0.8 % (ref 0.5–1.5)
Carboxyhemoglobin: <0.3 % — ABNORMAL LOW (ref 0.5–1.5)
Methemoglobin: <0.7 % (ref 0.0–1.5)
Methemoglobin: <0.7 % (ref 0.0–1.5)
Methemoglobin: <0.7 % (ref 0.0–1.5)
Methemoglobin: 1.8 % — ABNORMAL HIGH (ref 0.0–1.5)
O2 Saturation: 70.4 %
O2 Saturation: 59.1 %
O2 Saturation: 69.2 %
O2 Saturation: 60 %
Total hemoglobin: 11.7 g/dL — ABNORMAL LOW (ref 12.0–16.0)
Total hemoglobin: 13 g/dL (ref 12.0–16.0)
Total hemoglobin: 10.4 g/dL — ABNORMAL LOW (ref 12.0–16.0)
Total hemoglobin: 11.2 g/dL — ABNORMAL LOW (ref 12.0–16.0)

## 2022-03-21 LAB — ECHOCARDIOGRAM COMPLETE
Area-P 1/2: 5.62 cm2
Height: 62.992 in
S' Lateral: 4.6 cm
Weight: 3647.29 oz

## 2022-03-21 LAB — CBC
HCT: 34.5 % — ABNORMAL LOW (ref 36.0–46.0)
Hemoglobin: 11.7 g/dL — ABNORMAL LOW (ref 12.0–15.0)
MCH: 32.1 pg (ref 26.0–34.0)
MCHC: 33.9 g/dL (ref 30.0–36.0)
MCV: 94.5 fL (ref 80.0–100.0)
Platelets: 257 10*3/uL (ref 150–400)
RBC: 3.65 MIL/uL — ABNORMAL LOW (ref 3.87–5.11)
RDW: 13.8 % (ref 11.5–15.5)
WBC: 11.6 10*3/uL — ABNORMAL HIGH (ref 4.0–10.5)
nRBC: 0 % (ref 0.0–0.2)

## 2022-03-21 LAB — LIPID PANEL
Cholesterol: 206 mg/dL — ABNORMAL HIGH (ref 0–200)
HDL: 53 mg/dL (ref >40)
LDL Cholesterol: 133 mg/dL — ABNORMAL HIGH (ref 0–99)
Total CHOL/HDL Ratio: 3.9 RATIO
Triglycerides: 102 mg/dL (ref <150)
VLDL: 20 mg/dL (ref 0–40)

## 2022-03-21 LAB — RESP PANEL BY RT-PCR (RSV, FLU A&B, COVID)  RVPGX2
Influenza A by PCR: NEGATIVE
Influenza B by PCR: NEGATIVE
Resp Syncytial Virus by PCR: NEGATIVE
SARS Coronavirus 2 by RT PCR: NEGATIVE

## 2022-03-21 LAB — GLUCOSE, CAPILLARY
Glucose-Capillary: 118 mg/dL — ABNORMAL HIGH (ref 70–99)
Glucose-Capillary: 110 mg/dL — ABNORMAL HIGH (ref 70–99)
Glucose-Capillary: 133 mg/dL — ABNORMAL HIGH (ref 70–99)
Glucose-Capillary: 148 mg/dL — ABNORMAL HIGH (ref 70–99)

## 2022-03-21 LAB — POCT ACTIVATED CLOTTING TIME
Activated Clotting Time: 168 seconds
Activated Clotting Time: 141 seconds

## 2022-03-21 LAB — BASIC METABOLIC PANEL
Anion gap: 15 (ref 5–15)
BUN: 14 mg/dL (ref 8–23)
CO2: 22 mmol/L (ref 22–32)
Calcium: 9 mg/dL (ref 8.9–10.3)
Chloride: 103 mmol/L (ref 98–111)
Creatinine, Ser: 1.28 mg/dL — ABNORMAL HIGH (ref 0.44–1.00)
GFR, Estimated: 45 mL/min — ABNORMAL LOW (ref >60)
Glucose, Bld: 123 mg/dL — ABNORMAL HIGH (ref 70–99)
Potassium: 3.8 mmol/L (ref 3.5–5.1)
Sodium: 140 mmol/L (ref 135–145)

## 2022-03-21 LAB — HEMOGLOBIN A1C
Hgb A1c MFr Bld: 5.2 % (ref 4.8–5.6)
Mean Plasma Glucose: 102.54 mg/dL

## 2022-03-21 LAB — MRSA NEXT GEN BY PCR, NASAL: MRSA by PCR Next Gen: NOT DETECTED

## 2022-03-21 LAB — HIV ANTIBODY (ROUTINE TESTING W REFLEX): HIV Screen 4th Generation wRfx: NONREACTIVE

## 2022-03-21 LAB — TROPONIN I (HIGH SENSITIVITY): Troponin I (High Sensitivity): >24000 ng/L (ref <18)

## 2022-03-21 MED ORDER — AMOXICILLIN-POT CLAVULANATE 875-125 MG PO TABS
1.0000 | ORAL_TABLET | Freq: Two times a day (BID) | ORAL | Status: DC
  Administered 2022-03-21 – 2022-03-28 (×15): 1 via ORAL
  Filled 2022-03-21 (×16): qty 1

## 2022-03-21 MED ORDER — POTASSIUM CHLORIDE CRYS ER 20 MEQ PO TBCR
40.0000 meq | EXTENDED_RELEASE_TABLET | Freq: Once | ORAL | Status: AC
  Administered 2022-03-21: 40 meq via ORAL
  Filled 2022-03-21: qty 2

## 2022-03-21 MED ORDER — OXYCODONE HCL 5 MG PO TABS
5.0000 mg | ORAL_TABLET | ORAL | Status: DC | PRN
  Administered 2022-03-21 – 2022-03-27 (×7): 5 mg via ORAL
  Filled 2022-03-21 (×7): qty 1

## 2022-03-21 MED ORDER — FUROSEMIDE 10 MG/ML IJ SOLN
60.0000 mg | Freq: Two times a day (BID) | INTRAMUSCULAR | Status: DC
  Administered 2022-03-21 (×2): 60 mg via INTRAVENOUS
  Filled 2022-03-21 (×2): qty 6

## 2022-03-21 MED ORDER — DIGOXIN 125 MCG PO TABS
0.1250 mg | ORAL_TABLET | Freq: Every day | ORAL | Status: DC
  Administered 2022-03-21 – 2022-03-28 (×8): 0.125 mg via ORAL
  Filled 2022-03-21 (×8): qty 1

## 2022-03-21 MED ORDER — PERFLUTREN LIPID MICROSPHERE
1.0000 mL | INTRAVENOUS | Status: AC | PRN
  Administered 2022-03-21: 4 mL via INTRAVENOUS

## 2022-03-21 MED ORDER — "THROMBI-PAD 3""X3"" EX PADS"
1.0000 | MEDICATED_PAD | Freq: Once | CUTANEOUS | Status: AC
  Administered 2022-03-21: 1 via TOPICAL
  Filled 2022-03-21: qty 1

## 2022-03-21 MED ORDER — SACUBITRIL-VALSARTAN 24-26 MG PO TABS
1.0000 | ORAL_TABLET | Freq: Two times a day (BID) | ORAL | Status: DC
  Administered 2022-03-21 – 2022-03-23 (×6): 1 via ORAL
  Filled 2022-03-21 (×7): qty 1

## 2022-03-21 MED ORDER — HEPARIN (PORCINE) 25000 UT/250ML-% IV SOLN
1000.0000 [IU]/h | INTRAVENOUS | Status: DC
  Administered 2022-03-21: 1000 [IU]/h via INTRAVENOUS
  Filled 2022-03-21: qty 250

## 2022-03-21 MED ORDER — HEPARIN (PORCINE) 25000 UT/250ML-% IV SOLN: 800.0000 [IU]/h | INTRAVENOUS | Status: DC

## 2022-03-21 MED ORDER — SPIRONOLACTONE 12.5 MG HALF TABLET
12.5000 mg | ORAL_TABLET | Freq: Every day | ORAL | Status: DC
  Administered 2022-03-21 – 2022-03-25 (×5): 12.5 mg via ORAL
  Filled 2022-03-21 (×6): qty 1

## 2022-03-21 NOTE — Progress Notes (Signed)
ANTICOAGULATION CONSULT NOTE  Pharmacy Consult for Heparin Indication:  IABP  No Known Allergies  Patient Measurements: Height: 5' 2.99" (160 cm) Weight: 103.4 kg (227 lb 15.3 oz) IBW/kg (Calculated) : 52.38 Heparin Dosing Weight: 81 kg  Vital Signs: Temp: 99 F (37.2 C) (02/10 0500) Temp Source: Core (02/10 0400) BP: 117/88 (02/10 0600) Pulse Rate: 91 (02/10 0600)  Labs: Recent Labs    03/20/22 1955 03/20/22 2317 03/21/22 0047 03/21/22 0515  HGB 11.8* 11.9*  --  11.7*  HCT 36.5 35.0*  --  34.5*  PLT 278  --   --  257  CREATININE 1.48*  --   --  1.28*  TROPONINIHS 1,090*  --  >24,000*  --      Estimated Creatinine Clearance: 47 mL/min (A) (by C-G formula based on SCr of 1.28 mg/dL (H)).   Medical History: History reviewed. No pertinent past medical history.  Assessment: 57 yoF admitted as STEMI s/p DES with IABP placed. Heparin started and then held with bleeding from cath site, ok to resume this am per MD. Will resume at a lower rate.   Goal of Therapy:  Heparin level 0.2-0.5 units/ml Monitor platelets by anticoagulation protocol: Yes   Plan:  Resume heparin 800 units/h Heparin level in 8h  Arrie Senate, PharmD, [place], [facility] Clinical Pharmacist [phone] Please check AMION for all [facility] Pharmacy numbers 03/21/2022

## 2022-03-21 NOTE — Progress Notes (Signed)
ANTICOAGULATION CONSULT NOTE - Initial Consult  Pharmacy Consult for Heparin Indication:  IABP  No Known Allergies  Patient Measurements: Height: 5' 3"$  (160 cm) Weight: 103.3 kg (227 lb 11.8 oz) IBW/kg (Calculated) : 52.4 Heparin Dosing Weight: 81 kg  Vital Signs: Temp: 96.5 F (35.8 C) (02/09 2031) Temp Source: Axillary (02/09 2031) BP: 141/94 (02/09 2336) Pulse Rate: 93 (02/09 2336)  Labs: Recent Labs    03/20/22 1955 03/20/22 2317  HGB 11.8* 11.9*  HCT 36.5 35.0*  PLT 278  --   CREATININE 1.48*  --   TROPONINIHS 1,090*  --      Estimated Creatinine Clearance: 40.6 mL/min (A) (by C-G formula based on SCr of 1.48 mg/dL (H)).   Medical History: History reviewed. No pertinent past medical history.  Assessment: 72 y.o. with STEMI s/p PCI, IABP in place, for heparin   Goal of Therapy:  Heparin level 0.2-0.5 units/ml Monitor platelets by anticoagulation protocol: Yes   Plan:  Start Heparin 1000 units/hr F/u 8 hr heparin level  Phillis Knack, PharmD, BCPS  03/21/2022,12:51 AM

## 2022-03-21 NOTE — Progress Notes (Signed)
  2D Echocardiogram has been performed.  Rebecca Esparza 03/21/2022, 12:09 PM

## 2022-03-21 NOTE — Consult Note (Addendum)
Advanced Heart Failure Team Consult Note   Primary Physician: Patient, No Pcp Per PCP-Cardiologist:  None  Reason for Consultation: Cardiogenic shock  HPI:    Tambra Carkhuff is seen today for evaluation of cardiogenic shock at the request of Dr. Burt Knack.   Patient has history of HTN and smoking, was not on any cardiac meds at home.  She reports about 4 days of chest pain and dyspnea prior to admission.  Initially, CP was on and off, then became persistent.  She was short of breath walking around the house and orthopneic. On initial arrival to the ER, she was hypertensive with tachycardia and hypoxemia.  CXR with pulmonary edema.  ECG with anteroseptal and lateral ST elevation.  She was taken to the cath lab, cath showed 90% left main, occluded LAD, 70% pLCx, 60% pRCA.  She had DES to left main and PTCA extensively in the LAD with diffuse 70% residual proximal LAD stenosis.  RHC showed significantly elevated right and left heart filling pressures, IABP was placed and patient received IV Lasix.   She diuresed well overnight, I/Os net negative 2595.  No chest pain or dyspnea this morning.  IABP at 1:1.  Heparin gtt was stopped due to initial hematoma at radial cath site, this has resolved.    Swan: RA 11 PA 36/27 CI 2.35 Co-ox 70%  Review of Systems: All systems reviewed and negative except as per HPI.   Home Medications Prior to Admission medications   Medication Sig Start Date End Date Taking? Authorizing Provider  HYDROcodone-acetaminophen (NORCO/VICODIN) 5-325 MG per tablet Take 1-2 tablets by mouth every 4 (four) hours as needed for pain. 07/08/12   Charlann Lange, PA-C    Past Medical History: 1. HTN 2. Active smoker  Past Surgical History: Past Surgical History:  Procedure Laterality Date   ABDOMINAL HYSTERECTOMY  1973    Family History: History reviewed. No pertinent family history.  Social History: Social History   Socioeconomic History   Marital status:  Single    Spouse name: Not on file   Number of children: Not on file   Years of education: Not on file   Highest education level: Not on file  Occupational History   Not on file  Tobacco Use   Smoking status: Every Day   Smokeless tobacco: Never   Tobacco comments:    5-7 cigs/day  Vaping Use   Vaping Use: Never used  Substance and Sexual Activity   Alcohol use: Not Currently   Drug use: Not on file   Sexual activity: Not on file  Other Topics Concern   Not on file  Social History Narrative   Not on file   Social Determinants of Health   Financial Resource Strain: Not on file  Food Insecurity: Not on file  Transportation Needs: Not on file  Physical Activity: Not on file  Stress: Not on file  Social Connections: Not on file    Allergies:  No Known Allergies  Objective:    Vital Signs:   Temp:  [96.5 F (35.8 C)-99 F (37.2 C)] 99 F (37.2 C) (02/10 0500) Pulse Rate:  [84-128] 91 (02/10 0600) Resp:  [12-27] 26 (02/10 0600) BP: (98-163)/(67-119) 117/88 (02/10 0600) SpO2:  [68 %-100 %] 95 % (02/10 0600) Arterial Line BP: (129-147)/(97-109) 144/109 (02/10 0600) FiO2 (%):  [40 %] 40 % (02/09 2035) Weight:  [103.3 kg-117.9 kg] 103.4 kg (02/10 0600)    Weight change: Filed Weights   03/20/22 2035 03/21/22 0000  03/21/22 0600  Weight: 117.9 kg 103.3 kg 103.4 kg    Intake/Output:   Intake/Output Summary (Last 24 hours) at 03/21/2022 0731 Last data filed at 03/21/2022 0600 Gross per 24 hour  Intake 105.33 ml  Output 2700 ml  Net -2594.67 ml      Physical Exam    General:  Well appearing. No resp difficulty HEENT: normal Neck: supple. JVP 8-9 cm. Carotids 2+ bilat; no bruits. No lymphadenopathy or thyromegaly appreciated. Cor: PMI nondisplaced. Regular rate & rhythm. No rubs, gallops or murmurs. Lungs: clear Abdomen: soft, nontender, nondistended. No hepatosplenomegaly. No bruits or masses. Good bowel sounds. Extremities: no cyanosis, clubbing, rash,  edema Neuro: alert & orientedx3, cranial nerves grossly intact. moves all 4 extremities w/o difficulty. Affect pleasant   Telemetry   NSR, 90s (personally reviewed)  EKG    NSR, decreased but persistent anteroseptal STE (personally reviewed)  Labs   Basic Metabolic Panel: Recent Labs  Lab 03/20/22 1955 03/20/22 2317 03/21/22 0515  NA 136 141 140  K 4.1 3.8 3.8  CL 105  --  103  CO2 15*  --  22  GLUCOSE 475*  --  123*  BUN 12  --  14  CREATININE 1.48*  --  1.28*  CALCIUM 8.6*  --  9.0    Liver Function Tests: Recent Labs  Lab 03/20/22 1955  AST 53*  ALT 35  ALKPHOS 89  BILITOT 1.0  PROT 6.6  ALBUMIN 3.3*   No results for input(s): "LIPASE", "AMYLASE" in the last 168 hours. No results for input(s): "AMMONIA" in the last 168 hours.  CBC: Recent Labs  Lab 03/20/22 1955 03/20/22 2317 03/21/22 0515  WBC 9.9  --  11.6*  NEUTROABS 3.6  --   --   HGB 11.8* 11.9* 11.7*  HCT 36.5 35.0* 34.5*  MCV 99.7  --  94.5  PLT 278  --  257    Cardiac Enzymes: No results for input(s): "CKTOTAL", "CKMB", "CKMBINDEX", "TROPONINI" in the last 168 hours.  BNP: BNP (last 3 results) Recent Labs    03/20/22 1955  BNP 2,318.7*    ProBNP (last 3 results) No results for input(s): "PROBNP" in the last 8760 hours.   CBG: Recent Labs  Lab 03/20/22 2351  GLUCAP 139*    Coagulation Studies: No results for input(s): "LABPROT", "INR" in the last 72 hours.   Imaging   DG Chest Port 1 View  Result Date: 03/21/2022 CLINICAL DATA:  [id number], on intra-aortic balloon pump assist. EXAM: PORTABLE CHEST 1 VIEW COMPARISON:  Portable chest yesterday at 7:53 p.m. FINDINGS: 12:16 a.m. There are numerous overlying monitor wires. No visible pneumothorax. There is a right IJ Swan-Ganz line with the tip in the right pulmonary artery. The marker of an IABP superimposes to the left of T9-10 in the mid descending thoracic aorta. The aorta is tortuous with patchy calcification, stable  mediastinum. There is mild cardiomegaly. Central vessels are normal caliber. There previously were moderate patchy opacities in both lower lung fields with underlying interstitial thickening. Currently there is interstitial and patchy right infrahilar opacity with the other bilateral opacities noted previously having cleared. No substantial pleural effusion is seen. No acute osseous findings. In all other respects no further changes. IMPRESSION: 1. Support apparatus as above.  IABP marker is to the left of T9-10. 2. Improved aeration of the lower lung fields with residual interstitial and patchy right infrahilar opacity. 3. Stable cardiomegaly.  Central vessels are normal caliber. 4. Aortic atherosclerosis and tortuosity. Electronically  Signed   By: Telford Nab M.D.   On: 03/21/2022 00:37   CARDIAC CATHETERIZATION  Result Date: 03/20/2022   Mid LM to Dist LM lesion is 90% stenosed.   Ost LAD to Mid LAD lesion is 100% stenosed.   Ost Cx to Prox Cx lesion is 70% stenosed.   Prox RCA lesion is 60% stenosed.   Mid RCA to Dist RCA lesion is 50% stenosed.   A drug-eluting stent was successfully placed using a STENT MEGATRON 4.0X12.   Balloon angioplasty was performed using a BALLN EMERGE MR 2.0X20.   Post intervention, there is a 0% residual stenosis.   Post intervention, there is a 70% residual stenosis.   LV end diastolic pressure is severely elevated. 1.  Acute anterior STEMI secondary to proximal LAD occlusion, treated with extensive PTCA with restoration of TIMI-3 flow but severe diffuse residual stenosis 2.  Severe left main stenosis treated successfully with a 4.0 x 12 mm Synergy Megatron DES 3.  Moderate ostial circumflex stenosis with TIMI-3 flow in the vessel 4.  Moderate diffuse RCA stenosis involving a tortuous vessel with no severe disease identified 5.  Acute systolic heart failure with pulmonary edema/respiratory failure, treated with IV diuresis and intra-aortic balloon pump insertion 6.  Severely  elevated wedge pressure mean 33 mmHg Recommendations: 2D echo GDMT as tolerated IV heparin while intra-aortic balloon pump in place, continue one-to-one augmentation Check Co. oximetry/cardiac output/hemodynamics every shift IV diuresis  DG Chest Portable 1 View  Result Date: 03/20/2022 CLINICAL DATA:  Concern for pulmonary edema. EXAM: PORTABLE CHEST 1 VIEW COMPARISON:  None Available. FINDINGS: There is diffuse chronic interstitial coarsening and bronchitic changes. Cardiomegaly with mild central vascular congestion. Bilateral mid to lower lung field interstitial densities may represent edema, or pneumonia. No large pleural effusion. No obvious pneumothorax. Evaluation however is limited due to patient's positioning and superimposition of the jaw over the upper lung. No definite acute osseous pathology. IMPRESSION: 1. Cardiomegaly with mild central vascular congestion. 2. Bilateral mid to lower lung field interstitial densities may represent edema, or pneumonia. Electronically Signed   By: Anner Crete M.D.   On: 03/20/2022 20:09     Medications:     Current Medications:  amoxicillin-clavulanate  1 tablet Oral Q12H   aspirin EC  81 mg Oral Daily   atorvastatin  80 mg Oral Daily   Chlorhexidine Gluconate Cloth  6 each Topical Daily   digoxin  0.125 mg Oral Daily   furosemide  60 mg Intravenous BID   potassium chloride  40 mEq Oral Once   sacubitril-valsartan  1 tablet Oral BID   sodium chloride flush  3 mL Intravenous Q12H   spironolactone  12.5 mg Oral Daily   ticagrelor  90 mg Oral BID    Infusions:  sodium chloride     sodium chloride 10 mL/hr at 03/20/22 2106   sodium chloride     sodium chloride     sodium chloride 10 mL/hr at 03/21/22 0600   heparin       Assessment/Plan   1. CAD: Anterior STEMI, suspect late presentation given several days of stuttering symptoms. She was taken to the cath lab, cath showed 90% left main, occluded LAD, 70% pLCx, 60% pRCA.  She had DES to  left main and PTCA extensively in the LAD with diffuse 70% residual proximal LAD stenosis.  This morning, no chest pain.   - Restart heparin gtt with the IABP, cath site benign.  - Continue ASA 81/ticagrelor.  -  Atorvastatin 80 mg daily.  - She has significant residual LAD disease diffusely.  Will discuss future plans with Dr. Burt Knack => diffuse LAD disease, no plan for relook cath.  2. Acute systolic CHF/cardiogenic shock: BP elevated this morning.  CI 2.35 and co-ox 70%.  She diuresed well overnight with IV Lasix, CVP 11.   - I will start Entresto 24/26 bid.  - digoxin 0.125 daily - spironolactone 12.5 daily.  - Lasix 60 mg IV bid today.  - Will wean IABP to 1:2, after after a couple of hours will repeat co-ox and decide on ongoing wean.  - Echo today.  3. HTN: Adding meds as above.  4. Smoking: She says she has quit.  5. AKI: Creatinine lower at 1.28 this morning.  6. Tooth abscess: Abscess with significant pain.   - Start Augmentin.  7. Hyperglycemia: Initially, but HgbA1c only 5.2.   Length of Stay: 1  Loralie Champagne, MD  03/21/2022, 7:31 AM  Advanced Heart Failure Team Pager [phone] (M-F; 7a - 5p)  Please contact [facility] for night-coverage after hours (4p -7a ) and weekends on [email or web address]

## 2022-03-21 NOTE — H&P (Signed)
Cardiology Admission History and Physical   Patient ID: Rebecca Esparza MRN: [id number]; DOB: 04/27/1950   Admission date: 03/20/2022  PCP:  Patient, No Pcp Per   [place] Providers Cardiologist:  None        Chief Complaint:  Shortness of breath  Patient Profile:   Rebecca Esparza is a 72 y.o. female with HTN and tobacco use who is being seen 03/21/2022 for the evaluation of several days of shortness of breath with associated chest tightness and EKG findings concerning for STEMI.   History of Present Illness:   History is overall limited due to patient's clinical status.  Rebecca Esparza states that she was in her usual state of health until approximately 4 days ago when she developed chest tightness and associated shortness of breath with a persistent cough.  She reports that her symptoms progressively got worse leading up to her ED presentation when she could not take the discomfort any longer.  She reports that she had trouble ambulating without difficulty, as well as lying flat.  This has reportedly never happened before.  Aside from the chest discomfort and dyspnea, patient denies palpitations, lightheadedness/dizziness, nausea/vomiting, or syncope/presyncope.  She does endorse some ongoing tooth/jaw pain related to an oral abscess, but denies any fever/chills, or any other signs or symptoms of systemic infection.  She presented to the ED on 03/20/2022 for evaluation of her worsening shortness of breath and chest tightness.  In the ED she was tachycardic to the 110-120s, hypertensive (0000000 systolics), and was satting 83% on room air.  She was started on nonrebreather and given DuoNeb, subsequently placed on BiPAP.  EKG showed ST segment elevation most notable in V1-V3 and I/aVL.  Initial troponin was 1090.  Other notable labs include a BNP of 2318, glucose of 475, creatinine of 1.48, with an ion gap of 16 bicarb of 15.  Chest x-ray showed bilateral mid to lower lung field  densities concerning for possible edema.  She was given full dose aspirin, 60 mg of IV Lasix, and heparin 4000 units.  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 RHC showed a PCWP of 33 mmHg and she has an IABP in place.     Past Surgical History:  Procedure Laterality Date   ABDOMINAL HYSTERECTOMY  1973     Medications Prior to Admission: Prior to Admission medications   Medication Sig Start Date End Date Taking? Authorizing Provider  HYDROcodone-acetaminophen (NORCO/VICODIN) 5-325 MG per tablet Take 1-2 tablets by mouth every 4 (four) hours as needed for pain. 07/08/12   Charlann Lange, PA-C     Allergies:   No Known Allergies  Social History:   Social History   Socioeconomic History   Marital status: Single    Spouse name: Not on file   Number of children: Not on file   Years of education: Not on file   Highest education level: Not on file  Occupational History   Not on file  Tobacco Use   Smoking status: Every Day   Smokeless tobacco: Never   Tobacco comments:    5-7 cigs/day  Vaping Use   Vaping Use: Never used  Substance and Sexual Activity   Alcohol use: Not Currently   Drug use: Not on file   Sexual activity: Not on file  Other Topics Concern   Not on file  Social History Narrative   Not on  file   Social Determinants of Health   Financial Resource Strain: Not on file  Food Insecurity: Not on file  Transportation Needs: Not on file  Physical Activity: Not on file  Stress: Not on file  Social Connections: Not on file  Intimate Partner Violence: Not on file    Family History:   The patient's family history is not on file.    ROS:  Please see the history of present illness.  All other ROS reviewed and negative.     Physical Exam/Data:   Vitals:   03/20/22 2326 03/20/22 2331 03/20/22 2336 03/21/22 0000  BP: 138/86 (!) 134/93 (!) 141/94   Pulse: 97 90 93    Resp: 19 19 17   $ Temp:      TempSrc:      SpO2:      Weight:    103.3 kg  Height:        Intake/Output Summary (Last 24 hours) at 03/21/2022 0021 Last data filed at 03/20/2022 2339 Gross per 24 hour  Intake --  Output 800 ml  Net -800 ml      03/21/2022   12:00 AM 03/20/2022    8:35 PM  Last 3 Weights  Weight (lbs) 227 lb 11.8 oz 260 lb  Weight (kg) 103.3 kg 117.935 kg     Body mass index is 40.34 kg/m.  General:  Well nourished, well developed, in moderate distress HEENT: normal Neck: JVD difficult to appreciate given body habitus Cardiac:  normal S1, S2; tachycardia, regular rhythm; difficult to auscultate heart sounds over course breathing Lungs: Diffuse rales appreciated in the anterior chest, no rhonchi or wheezing Abd: soft, nontender, obese  Ext: no pedal edema, cold distal extremities Musculoskeletal: Grossly normal Neuro: No gross deficits Psych:  Normal affect    Laboratory Data:  High Sensitivity Troponin:   Recent Labs  Lab 03/20/22 1955  TROPONINIHS 1,090*      Chemistry Recent Labs  Lab 03/20/22 1955 03/20/22 2317  NA 136 141  K 4.1 3.8  CL 105  --   CO2 15*  --   GLUCOSE 475*  --   BUN 12  --   CREATININE 1.48*  --   CALCIUM 8.6*  --   GFRNONAA 38*  --   ANIONGAP 16*  --     Recent Labs  Lab 03/20/22 1955  PROT 6.6  ALBUMIN 3.3*  AST 53*  ALT 35  ALKPHOS 89  BILITOT 1.0   Lipids No results for input(s): "CHOL", "TRIG", "HDL", "LABVLDL", "LDLCALC", "CHOLHDL" in the last 168 hours. Hematology Recent Labs  Lab 03/20/22 1955 03/20/22 2317  WBC 9.9  --   RBC 3.66*  --   HGB 11.8* 11.9*  HCT 36.5 35.0*  MCV 99.7  --   MCH 32.2  --   MCHC 32.3  --   RDW 13.7  --   PLT 278  --    Thyroid No results for input(s): "TSH", "FREET4" in the last 168 hours. BNP Recent Labs  Lab 03/20/22 1955  BNP 2,318.7*    DDimer No results for input(s): "DDIMER" in the last 168 hours.   Radiology/Studies:  CARDIAC  CATHETERIZATION  Result Date: 03/20/2022   Mid LM to Dist LM lesion is 90% stenosed.   Ost LAD to Mid LAD lesion is 100% stenosed.   Ost Cx to Prox Cx lesion is 70% stenosed.   Prox RCA lesion is 60% stenosed.   Mid RCA to Dist RCA lesion is 50% stenosed.  A drug-eluting stent was successfully placed using a STENT MEGATRON 4.0X12.   Balloon angioplasty was performed using a BALLN EMERGE MR 2.0X20.   Post intervention, there is a 0% residual stenosis.   Post intervention, there is a 70% residual stenosis.   LV end diastolic pressure is severely elevated. 1.  Acute anterior STEMI secondary to proximal LAD occlusion, treated with extensive PTCA with restoration of TIMI-3 flow but severe diffuse residual stenosis 2.  Severe left main stenosis treated successfully with a 4.0 x 12 mm Synergy Megatron DES 3.  Moderate ostial circumflex stenosis with TIMI-3 flow in the vessel 4.  Moderate diffuse RCA stenosis involving a tortuous vessel with no severe disease identified 5.  Acute systolic heart failure with pulmonary edema/respiratory failure, treated with IV diuresis and intra-aortic balloon pump insertion 6.  Severely elevated wedge pressure mean 33 mmHg Recommendations: 2D echo GDMT as tolerated IV heparin while intra-aortic balloon pump in place, continue one-to-one augmentation Check Co. oximetry/cardiac output/hemodynamics every shift IV diuresis  DG Chest Portable 1 View  Result Date: 03/20/2022 CLINICAL DATA:  Concern for pulmonary edema. EXAM: PORTABLE CHEST 1 VIEW COMPARISON:  None Available. FINDINGS: There is diffuse chronic interstitial coarsening and bronchitic changes. Cardiomegaly with mild central vascular congestion. Bilateral mid to lower lung field interstitial densities may represent edema, or pneumonia. No large pleural effusion. No obvious pneumothorax. Evaluation however is limited due to patient's positioning and superimposition of the jaw over the upper lung. No definite acute osseous  pathology. IMPRESSION: 1. Cardiomegaly with mild central vascular congestion. 2. Bilateral mid to lower lung field interstitial densities may represent edema, or pneumonia. Electronically Signed   By: Anner Crete M.D.   On: 03/20/2022 20:09     Assessment and Plan:   Rebecca Esparza is a 72 y.o. female with HTN, tobacco use, and newly diagnosed multivessel CAD who presented with several days of shortness of breath with associated chest tightness and was found to have a STEMI s/p PCI of the LM and proximal LAD. Her clinical course is complicated by cardiogenic shock with acute systolic heart failure with IABP in place.   # STEMI s/p PCI # Cardiogenic Shock # CAD # Tobacco Use - Continue DAPT with ASA and ticagrelor - TTE in AM - Discuss with Advanced Heart Failure - Aggressive risk factor modification with glycemic control, smoking cessation, and GDMT (to be started once more objective data obtained; statin, ACEi, beta-blocker and when clinically appropriate)             - Lipid panel and A1c pending -  Monitor strict I &Os and weights - Continue diuresis   # Elevated blood sugars :: Initially severely elevated at admission, but now downtrending.  - Check A1c - Continue to trend with POC glucose checks       Risk Assessment/Risk Scores:    TIMI Risk Score for ST  Elevation MI:   The patient's TIMI risk score is 9, which indicates a 35.9% risk of all cause mortality at 30 days.   New York Heart Association (NYHA) Functional Class NYHA Class III     Severity of Illness: The appropriate patient status for this patient is INPATIENT. Inpatient status is judged to be reasonable and necessary in order to provide the required intensity of service to ensure the patient's safety. The patient's presenting symptoms, physical exam findings, and initial radiographic and laboratory data in the context of their chronic comorbidities is felt to place them at high risk for further  clinical deterioration. Furthermore, it is not anticipated that the patient will be medically stable for discharge from the hospital within 2 midnights of admission.   * I certify that at the point of admission it is my clinical judgment that the patient will require inpatient hospital care spanning beyond 2 midnights from the point of admission due to high intensity of service, high risk for further deterioration and high frequency of surveillance required.*   For questions or updates, please contact [place] Please consult [email or web address] for contact info under     Signed, Clois Dupes, MD  03/21/2022 12:21 AM

## 2022-03-21 NOTE — Progress Notes (Signed)
Call to removed balloon bump. ACT 141. Balloon pump removed intact. Manual pressure applied x 35 min. No bleeding or hematoma noted. 4x4 tegaderm dressing applied to site. Post activity and precautions explained. Right dp pulse dopplered post IABP removal. VSS remained stable. RN at bedside; care released.Cindee Salt

## 2022-03-22 DIAGNOSIS — I5021 Acute systolic (congestive) heart failure: Secondary | ICD-10-CM | POA: Diagnosis not present

## 2022-03-22 DIAGNOSIS — I2102 ST elevation (STEMI) myocardial infarction involving left anterior descending coronary artery: Secondary | ICD-10-CM | POA: Diagnosis not present

## 2022-03-22 DIAGNOSIS — R570 Cardiogenic shock: Secondary | ICD-10-CM | POA: Diagnosis not present

## 2022-03-22 LAB — CBC
HCT: 33.4 % — ABNORMAL LOW (ref 36.0–46.0)
Hemoglobin: 11.1 g/dL — ABNORMAL LOW (ref 12.0–15.0)
MCH: 31.4 pg (ref 26.0–34.0)
MCHC: 33.2 g/dL (ref 30.0–36.0)
MCV: 94.4 fL (ref 80.0–100.0)
Platelets: 192 10*3/uL (ref 150–400)
RBC: 3.54 MIL/uL — ABNORMAL LOW (ref 3.87–5.11)
RDW: 13.6 % (ref 11.5–15.5)
WBC: 9 10*3/uL (ref 4.0–10.5)
nRBC: 0 % (ref 0.0–0.2)

## 2022-03-22 LAB — COOXEMETRY PANEL
Carboxyhemoglobin: 1.3 % (ref 0.5–1.5)
Methemoglobin: <0.7 % (ref 0.0–1.5)
O2 Saturation: 62.8 %
Total hemoglobin: 11.4 g/dL — ABNORMAL LOW (ref 12.0–16.0)

## 2022-03-22 LAB — BASIC METABOLIC PANEL
Anion gap: 9 (ref 5–15)
BUN: 20 mg/dL (ref 8–23)
CO2: 24 mmol/L (ref 22–32)
Calcium: 8.9 mg/dL (ref 8.9–10.3)
Chloride: 104 mmol/L (ref 98–111)
Creatinine, Ser: 1.18 mg/dL — ABNORMAL HIGH (ref 0.44–1.00)
GFR, Estimated: 50 mL/min — ABNORMAL LOW (ref >60)
Glucose, Bld: 129 mg/dL — ABNORMAL HIGH (ref 70–99)
Potassium: 3.8 mmol/L (ref 3.5–5.1)
Sodium: 137 mmol/L (ref 135–145)

## 2022-03-22 LAB — POCT I-STAT EG7
Acid-base deficit: 2 mmol/L (ref 0.0–2.0)
Bicarbonate: 24.5 mmol/L (ref 20.0–28.0)
Calcium, Ion: 1.21 mmol/L (ref 1.15–1.40)
HCT: 35 % — ABNORMAL LOW (ref 36.0–46.0)
Hemoglobin: 11.9 g/dL — ABNORMAL LOW (ref 12.0–15.0)
O2 Saturation: 61 %
Potassium: 3.8 mmol/L (ref 3.5–5.1)
Sodium: 141 mmol/L (ref 135–145)
TCO2: 26 mmol/L (ref 22–32)
pCO2, Ven: 50 mmHg (ref 44–60)
pH, Ven: 7.297 (ref 7.25–7.43)
pO2, Ven: 35 mmHg (ref 32–45)

## 2022-03-22 LAB — GLUCOSE, CAPILLARY
Glucose-Capillary: 186 mg/dL — ABNORMAL HIGH (ref 70–99)
Glucose-Capillary: 118 mg/dL — ABNORMAL HIGH (ref 70–99)
Glucose-Capillary: 123 mg/dL — ABNORMAL HIGH (ref 70–99)

## 2022-03-22 MED ORDER — ENOXAPARIN SODIUM 40 MG/0.4ML IJ SOSY
40.0000 mg | PREFILLED_SYRINGE | INTRAMUSCULAR | Status: DC
  Administered 2022-03-22 – 2022-03-28 (×7): 40 mg via SUBCUTANEOUS
  Filled 2022-03-22 (×7): qty 0.4

## 2022-03-22 MED ORDER — DAPAGLIFLOZIN PROPANEDIOL 10 MG PO TABS
10.0000 mg | ORAL_TABLET | Freq: Every day | ORAL | Status: DC
  Administered 2022-03-22 – 2022-03-28 (×7): 10 mg via ORAL
  Filled 2022-03-22 (×7): qty 1

## 2022-03-22 MED ORDER — POTASSIUM CHLORIDE CRYS ER 20 MEQ PO TBCR
40.0000 meq | EXTENDED_RELEASE_TABLET | Freq: Once | ORAL | Status: AC
  Administered 2022-03-22: 40 meq via ORAL
  Filled 2022-03-22: qty 2

## 2022-03-22 NOTE — Progress Notes (Addendum)
Patient ID: Rebecca Esparza, female   DOB: Jun 01, 1950, 72 y.o.   MRN: [id number]     Advanced Heart Failure Rounding Note  PCP-Cardiologist: None   Subjective:    IABP removed yesterday.   Good diuresis overnight, weight down with I/Os net negative -2326.  No BMET this morning.   No dyspnea or chest pain this morning.   Swan numbers: CVP 7-9 PA 25/16 CI 1.8 Co-ox 63%  Echo: EF 20-25%, no LV thrombus, normal RV, IVC normal, trivial MR.    Objective:   Weight Range: 99.3 kg Body mass index is 38.79 kg/m.   Vital Signs:   Temp:  [98.8 F (37.1 C)-99.7 F (37.6 C)] 98.8 F (37.1 C) (02/11 0738) Pulse Rate:  [80-103] 97 (02/11 0738) Resp:  [11-45] 18 (02/11 0738) BP: (89-140)/(39-118) 106/61 (02/11 0700) SpO2:  [91 %-99 %] 96 % (02/11 0738) Arterial Line BP: (137-167)/(82-111) 137/89 (02/10 1600) Weight:  [99.3 kg] 99.3 kg (02/11 0600) Last BM Date :  (PTA)  Weight change: Filed Weights   03/21/22 0000 03/21/22 0600 03/22/22 0600  Weight: 103.3 kg 103.4 kg 99.3 kg    Intake/Output:   Intake/Output Summary (Last 24 hours) at 03/22/2022 0739 Last data filed at 03/22/2022 0600 Gross per 24 hour  Intake 473.77 ml  Output 2800 ml  Net -2326.23 ml      Physical Exam    General: NAD Neck: No JVD, no thyromegaly or thyroid nodule.  Lungs: Clear to auscultation bilaterally with normal respiratory effort. CV: Nondisplaced PMI.  Heart regular S1/S2, no S3/S4, no murmur.  No peripheral edema.   Abdomen: Soft, nontender, no hepatosplenomegaly, no distention.  Skin: Intact without lesions or rashes.  Neurologic: Alert and oriented x 3.  Psych: Normal affect. Extremities: No clubbing or cyanosis.  HEENT: Normal.    Telemetry   NSR 90s (personally reviewed)   Labs    CBC Recent Labs    03/20/22 1955 03/20/22 2317 03/21/22 0515 03/22/22 0544  WBC 9.9  --  11.6* 9.0  NEUTROABS 3.6  --   --   --   HGB 11.8*   < > 11.7* 11.1*  HCT 36.5   < > 34.5* 33.4*   MCV 99.7  --  94.5 94.4  PLT 278  --  257 192   < > = values in this interval not displayed.   Basic Metabolic Panel Recent Labs    03/20/22 1955 03/20/22 2317 03/21/22 0515  NA 136 141 140  K 4.1 3.8 3.8  CL 105  --  103  CO2 15*  --  22  GLUCOSE 475*  --  123*  BUN 12  --  14  CREATININE 1.48*  --  1.28*  CALCIUM 8.6*  --  9.0   Liver Function Tests Recent Labs    03/20/22 1955  AST 53*  ALT 35  ALKPHOS 89  BILITOT 1.0  PROT 6.6  ALBUMIN 3.3*   No results for input(s): "LIPASE", "AMYLASE" in the last 72 hours. Cardiac Enzymes No results for input(s): "CKTOTAL", "CKMB", "CKMBINDEX", "TROPONINI" in the last 72 hours.  BNP: BNP (last 3 results) Recent Labs    03/20/22 1955  BNP 2,318.7*    ProBNP (last 3 results) No results for input(s): "PROBNP" in the last 8760 hours.   D-Dimer No results for input(s): "DDIMER" in the last 72 hours. Hemoglobin A1C Recent Labs    03/21/22 0515  HGBA1C 5.2   Fasting Lipid Panel Recent Labs    03/21/22  0515  CHOL 206*  HDL 53  LDLCALC 133*  TRIG 102  CHOLHDL 3.9   Thyroid Function Tests No results for input(s): "TSH", "T4TOTAL", "T3FREE", "THYROIDAB" in the last 72 hours.  Invalid input(s): "FREET3"  Other results:   Imaging    ECHOCARDIOGRAM COMPLETE  Result Date: 03/21/2022    ECHOCARDIOGRAM REPORT   Patient Name:   Rebecca Esparza Date of Exam: 03/21/2022 Medical Rec #:  [id number]        Height:       63.0 in Accession #:    [id number]       Weight:       228.0 lb Date of Birth:  14-Jul-1950        BSA:          2.044 m Patient Age:    62 years         BP:           131/102 mmHg Patient Gender: F                HR:           90 bpm. Exam Location:  Inpatient Procedure: 2D Echo Indications:    CAD of native vessel. Acute systolic CHF.  History:        Patient has no prior history of Echocardiogram examinations.                 CAD, Signs/Symptoms:Shortness of Breath; Risk Factors:Current                  Smoker and Hypertension.  Sonographer:    Johny Chess RDCS Referring Phys: [place]  1. Left ventricular ejection fraction, by estimation, is 20 to 25%. The left ventricle has severely decreased function. The left ventricle demonstrates regional wall motion abnormalities (see scoring diagram/findings for description). The left ventricular internal cavity size was mildly dilated. There is mild left ventricular hypertrophy. Left ventricular diastolic parameters are consistent with Grade I diastolic dysfunction (impaired relaxation).  2. Right ventricular systolic function was not well visualized. The right ventricular size is normal. There is normal pulmonary artery systolic pressure. The estimated right ventricular systolic pressure is 99991111 mmHg.  3. The mitral valve is normal in structure. Trivial mitral valve regurgitation. No evidence of mitral stenosis.  4. The aortic valve is tricuspid. Aortic valve regurgitation is not visualized. No aortic stenosis is present.  5. The inferior vena cava is normal in size with greater than 50% respiratory variability, suggesting right atrial pressure of 3 mmHg. Conclusion(s)/Recommendation(s): No left ventricular mural or apical thrombus/thrombi. FINDINGS  Left Ventricle: Left ventricular ejection fraction, by estimation, is 20 to 25%. The left ventricle has severely decreased function. The left ventricle demonstrates regional wall motion abnormalities. Definity contrast agent was given IV to delineate the left ventricular endocardial borders. The left ventricular internal cavity size was mildly dilated. There is mild left ventricular hypertrophy. Left ventricular diastolic parameters are consistent with Grade I diastolic dysfunction (impaired relaxation).  LV Wall Scoring: The anterior septum, apical anterior segment, and apex are akinetic. The anterior wall and inferior septum are severely hypokinetic. Right Ventricle: The right ventricular size  is normal. No increase in right ventricular wall thickness. Right ventricular systolic function was not well visualized. There is normal pulmonary artery systolic pressure. The tricuspid regurgitant velocity is  2.65 m/s, and with an assumed right atrial pressure of 3 mmHg, the estimated right ventricular systolic pressure is 99991111 mmHg. Left Atrium: Left atrial  size was normal in size. Right Atrium: Right atrial size was normal in size. Pericardium: There is no evidence of pericardial effusion. Mitral Valve: The mitral valve is normal in structure. Trivial mitral valve regurgitation. No evidence of mitral valve stenosis. Tricuspid Valve: The tricuspid valve is not well visualized. Tricuspid valve regurgitation is not demonstrated. No evidence of tricuspid stenosis. Aortic Valve: The aortic valve is tricuspid. Aortic valve regurgitation is not visualized. No aortic stenosis is present. Pulmonic Valve: The pulmonic valve was not well visualized. Pulmonic valve regurgitation is not visualized. No evidence of pulmonic stenosis. Aorta: The aortic root is normal in size and structure. Venous: The inferior vena cava is normal in size with greater than 50% respiratory variability, suggesting right atrial pressure of 3 mmHg. IAS/Shunts: No atrial level shunt detected by color flow Doppler.  LEFT VENTRICLE PLAX 2D LVIDd:         5.40 cm   Diastology LVIDs:         4.60 cm   LV e' medial:    4.90 cm/s LV PW:         1.10 cm   LV E/e' medial:  10.7 LV IVS:        1.20 cm   LV e' lateral:   6.42 cm/s LVOT diam:     1.90 cm   LV E/e' lateral: 8.2 LV SV:         32 LV SV Index:   16 LVOT Area:     2.84 cm  RIGHT VENTRICLE          IVC RV Basal diam:  2.40 cm  IVC diam: 1.40 cm LEFT ATRIUM             Index        RIGHT ATRIUM          Index LA diam:        3.10 cm 1.52 cm/m   RA Area:     9.19 cm LA Vol (A2C):   42.0 ml 20.55 ml/m  RA Volume:   18.50 ml 9.05 ml/m LA Vol (A4C):   38.2 ml 18.69 ml/m LA Biplane Vol: 41.4 ml  20.25 ml/m  AORTIC VALVE LVOT Vmax:   82.10 cm/s LVOT Vmean:  49.900 cm/s LVOT VTI:    0.112 m  AORTA Ao Root diam: 3.60 cm MITRAL VALVE               TRICUSPID VALVE MV Area (PHT): 5.62 cm    TR Peak grad:   28.1 mmHg MV Decel Time: 135 msec    TR Vmax:        265.00 cm/s MV E velocity: 52.50 cm/s MV A velocity: 82.30 cm/s  SHUNTS MV E/A ratio:  0.64        Systemic VTI:  0.11 m                            Systemic Diam: 1.90 cm Vishnu Priya Mallipeddi Electronically signed by Lorelee Cover Mallipeddi Signature Date/Time: 03/21/2022/3:30:22 PM    Final      Medications:     Scheduled Medications:  amoxicillin-clavulanate  1 tablet Oral Q12H   aspirin EC  81 mg Oral Daily   atorvastatin  80 mg Oral Daily   Chlorhexidine Gluconate Cloth  6 each Topical Daily   dapagliflozin propanediol  10 mg Oral Daily   digoxin  0.125 mg Oral Daily   sacubitril-valsartan  1  tablet Oral BID   sodium chloride flush  3 mL Intravenous Q12H   spironolactone  12.5 mg Oral Daily   ticagrelor  90 mg Oral BID    Infusions:  sodium chloride     sodium chloride 10 mL/hr at 03/20/22 2106   sodium chloride     sodium chloride     sodium chloride 10 mL/hr at 03/22/22 0600    PRN Medications: sodium chloride, sodium chloride, acetaminophen, nitroGLYCERIN, ondansetron (ZOFRAN) IV, oxyCODONE, sodium chloride flush   Assessment/Plan   1. CAD: Anterior STEMI, suspect late presentation given several days of stuttering symptoms. She was taken to the cath lab, cath showed 90% left main, occluded LAD, 70% pLCx, 60% pRCA.  She had DES to left main and PTCA extensively in the LAD with diffuse 70% residual proximal LAD stenosis.  She has significant diffuse residual LAD disease, discussed with Dr. Burt Knack and not planned for relook cath at this time.  No chest pain.   - Continue ASA 81/ticagrelor.  - Atorvastatin 80 mg daily.  2. Acute systolic CHF/cardiogenic shock:  Ischemic cardiomyopathy.  Echo showed EF 20-25%, no LV  thrombus, normal RV, IVC normal, trivial MR. This morning, CVP 7-9 with co-ox 63% but CI by thermo low at 1.8.  No BMET yet.   - Stat BMET.  - She is not on an inotrope, will leave off for now as she is symptomatically stable and not significantly volume overloaded.  - Will leave Swan in today, if remains stable can remove tomorrow.  - Add Farxiga 10 which will give a bit of diuresis.  - Continue Entresto 24/26 bid, no BP room to increase.  - digoxin 0.125 daily - spironolactone 12.5 daily.  - Would place Lifevest at discharge.  3. HTN: BP now is not elevated.  4. Smoking: She says she has quit.  5. AKI: Creatinine had trended down to 1.28 yesterday, pending today.   6. Tooth abscess: Abscess with significant pain.   - Augmentin.  7. Hyperglycemia: Initially, but HgbA1c only 5.2.  8. DVT prophylaxis: Lovenox.   Can mobilize today, up to chair.   CRITICAL CARE Performed by: Loralie Champagne  Total critical care time: 35 minutes  Critical care time was exclusive of separately billable procedures and treating other patients.  Critical care was necessary to treat or prevent imminent or life-threatening deterioration.  Critical care was time spent personally by me on the following activities: development of treatment plan with patient and/or surrogate as well as nursing, discussions with consultants, evaluation of patient's response to treatment, examination of patient, obtaining history from patient or surrogate, ordering and performing treatments and interventions, ordering and review of laboratory studies, ordering and review of radiographic studies, pulse oximetry and re-evaluation of patient's condition.  Length of Stay: 2  Loralie Champagne, MD  03/22/2022, 7:39 AM  Advanced Heart Failure Team Pager [phone] (M-F; 7a - 5p)  Please contact [facility] for night-coverage after hours (5p -7a ) and weekends on [email or web address]

## 2022-03-23 ENCOUNTER — Encounter (HOSPITAL_COMMUNITY): Payer: Self-pay | Admitting: Cardiovascular Disease

## 2022-03-23 ENCOUNTER — Other Ambulatory Visit (HOSPITAL_COMMUNITY): Payer: Self-pay

## 2022-03-23 DIAGNOSIS — I2102 ST elevation (STEMI) myocardial infarction involving left anterior descending coronary artery: Secondary | ICD-10-CM | POA: Diagnosis not present

## 2022-03-23 LAB — COOXEMETRY PANEL
Carboxyhemoglobin: 1.5 % (ref 0.5–1.5)
Methemoglobin: <0.7 % (ref 0.0–1.5)
O2 Saturation: 60.8 %
Total hemoglobin: 10.4 g/dL — ABNORMAL LOW (ref 12.0–16.0)

## 2022-03-23 LAB — CBC
HCT: 31.4 % — ABNORMAL LOW (ref 36.0–46.0)
Hemoglobin: 9.9 g/dL — ABNORMAL LOW (ref 12.0–15.0)
MCH: 31.1 pg (ref 26.0–34.0)
MCHC: 31.5 g/dL (ref 30.0–36.0)
MCV: 98.7 fL (ref 80.0–100.0)
Platelets: 182 10*3/uL (ref 150–400)
RBC: 3.18 MIL/uL — ABNORMAL LOW (ref 3.87–5.11)
RDW: 13.4 % (ref 11.5–15.5)
WBC: 8.5 10*3/uL (ref 4.0–10.5)
nRBC: 0 % (ref 0.0–0.2)

## 2022-03-23 LAB — BASIC METABOLIC PANEL
Anion gap: 8 (ref 5–15)
BUN: 18 mg/dL (ref 8–23)
CO2: 22 mmol/L (ref 22–32)
Calcium: 8.2 mg/dL — ABNORMAL LOW (ref 8.9–10.3)
Chloride: 108 mmol/L (ref 98–111)
Creatinine, Ser: 1.12 mg/dL — ABNORMAL HIGH (ref 0.44–1.00)
GFR, Estimated: 53 mL/min — ABNORMAL LOW (ref >60)
Glucose, Bld: 97 mg/dL (ref 70–99)
Potassium: 3.6 mmol/L (ref 3.5–5.1)
Sodium: 138 mmol/L (ref 135–145)

## 2022-03-23 LAB — GLUCOSE, CAPILLARY
Glucose-Capillary: 111 mg/dL — ABNORMAL HIGH (ref 70–99)
Glucose-Capillary: 114 mg/dL — ABNORMAL HIGH (ref 70–99)
Glucose-Capillary: 120 mg/dL — ABNORMAL HIGH (ref 70–99)

## 2022-03-23 MED ORDER — POTASSIUM CHLORIDE CRYS ER 20 MEQ PO TBCR
40.0000 meq | EXTENDED_RELEASE_TABLET | Freq: Once | ORAL | Status: AC
  Administered 2022-03-23: 40 meq via ORAL
  Filled 2022-03-23: qty 2

## 2022-03-23 NOTE — TOC CM/SW Note (Signed)
Received referral for [place] referral to [facility], Chrys Racer. Left message on voice mail for a return call. [place], Heart Failure TOC CM [phone]

## 2022-03-23 NOTE — Progress Notes (Addendum)
Patient ID: Rebecca Esparza, female   DOB: [birth year], 72 y.o.   MRN: [id number]     Advanced Heart Failure Rounding Note  PCP-Cardiologist: None   Patient Profile 72 y/o female w/ h/o HTN and tobacco use, admitted for anterior STEMI c/b acute systolic heart failure/cardiogenic shock, suspect late presentation MI given several days of stuttering symptoms. Cath showed 90% left main, occluded LAD, 70% pLCx, 60% pRCA.  She had DES to left main and PTCA extensively in the LAD with diffuse 70% residual proximal LAD stenosis (medical management). EF 20-25%, no LV thrombus, normal RV. Stabilized w/ IABP and inotropes.   Subjective:    IABP discontinued 12/10     No events overnight. Co-ox 61%. Wt down w/ diuresis. CVP 6-7   BP remains soft [id number] systolic. SCr 1.3>>1.1   Denies CP. No dyspnea. Appetite is good.   Echo: EF 20-25%, no LV thrombus, normal RV, IVC normal, trivial MR.   Today's Swan #s PAP: (23-35)/(15-25) 35/19 CVP:  [5 mmHg-31 mmHg]  6 mmHg CO:  [4.1 L/min-4.3 L/min] 4.3 L/min CI:  [2 L/min/m2-2.1 L/min/m2] 2.1 L/min/m2    Objective:   Weight Range: 97.7 kg Body mass index is 38.16 kg/m.   Vital Signs:   Temp:  [98.4 F (36.9 C)-99.7 F (37.6 C)] 99.7 F (37.6 C) (02/12 0700) Pulse Rate:  [77-109] 90 (02/12 0700) Resp:  [12-24] 18 (02/12 0700) BP: (88-134)/(54-97) 99/54 (02/12 0500) SpO2:  [90 %-96 %] 94 % (02/12 0700) Weight:  [97.7 kg] 97.7 kg (02/12 0600) Last BM Date :  (PTA)  Weight change: Filed Weights   03/21/22 0600 03/22/22 0600 03/23/22 0600  Weight: 103.4 kg 99.3 kg 97.7 kg    Intake/Output:   Intake/Output Summary (Last 24 hours) at 03/23/2022 0745 Last data filed at 03/23/2022 0600 Gross per 24 hour  Intake 243.03 ml  Output 950 ml  Net -706.97 ml      Physical Exam   CVP 6-7  General:  Well appearing. No respiratory difficulty HEENT: normal Neck: supple. JVP 7 cm, + Rt IJ Swan. Carotids 2+ bilat; no bruits. No  lymphadenopathy or thyromegaly appreciated. Cor: PMI nondisplaced. Regular rate & rhythm. No rubs, gallops or murmurs. Lungs: clear Abdomen: soft, nontender, nondistended. No hepatosplenomegaly. No bruits or masses. Good bowel sounds. Extremities: no cyanosis, clubbing, rash, edema Neuro: alert & oriented x 3, cranial nerves grossly intact. moves all 4 extremities w/o difficulty. Affect pleasant.    Telemetry   NSR 90s (personally reviewed)   Labs    CBC Recent Labs    03/20/22 1955 03/20/22 2317 03/22/22 0544 03/23/22 0524  WBC 9.9   < > 9.0 8.5  NEUTROABS 3.6  --   --   --   HGB 11.8*   < > 11.1* 9.9*  HCT 36.5   < > 33.4* 31.4*  MCV 99.7   < > 94.4 98.7  PLT 278   < > 192 182   < > = values in this interval not displayed.   Basic Metabolic Panel Recent Labs    03/22/22 0745 03/23/22 0524  NA 137 138  K 3.8 3.6  CL 104 108  CO2 24 22  GLUCOSE 129* 97  BUN 20 18  CREATININE 1.18* 1.12*  CALCIUM 8.9 8.2*   Liver Function Tests Recent Labs    03/20/22 1955  AST 53*  ALT 35  ALKPHOS 89  BILITOT 1.0  PROT 6.6  ALBUMIN 3.3*   No results for input(s): "LIPASE", "  AMYLASE" in the last 72 hours. Cardiac Enzymes No results for input(s): "CKTOTAL", "CKMB", "CKMBINDEX", "TROPONINI" in the last 72 hours.  BNP: BNP (last 3 results) Recent Labs    03/20/22 1955  BNP 2,318.7*    ProBNP (last 3 results) No results for input(s): "PROBNP" in the last 8760 hours.   D-Dimer No results for input(s): "DDIMER" in the last 72 hours. Hemoglobin A1C Recent Labs    03/21/22 0515  HGBA1C 5.2   Fasting Lipid Panel Recent Labs    03/21/22 0515  CHOL 206*  HDL 53  LDLCALC 133*  TRIG 102  CHOLHDL 3.9   Thyroid Function Tests No results for input(s): "TSH", "T4TOTAL", "T3FREE", "THYROIDAB" in the last 72 hours.  Invalid input(s): "FREET3"  Other results:   Imaging    No results found.   Medications:     Scheduled Medications:   amoxicillin-clavulanate  1 tablet Oral Q12H   aspirin EC  81 mg Oral Daily   atorvastatin  80 mg Oral Daily   Chlorhexidine Gluconate Cloth  6 each Topical Daily   dapagliflozin propanediol  10 mg Oral Daily   digoxin  0.125 mg Oral Daily   enoxaparin (LOVENOX) injection  40 mg Subcutaneous Q24H   sacubitril-valsartan  1 tablet Oral BID   sodium chloride flush  3 mL Intravenous Q12H   spironolactone  12.5 mg Oral Daily   ticagrelor  90 mg Oral BID    Infusions:  sodium chloride     sodium chloride 10 mL/hr at 03/20/22 2106   sodium chloride     sodium chloride     sodium chloride 10 mL/hr at 03/23/22 0600    PRN Medications: sodium chloride, sodium chloride, acetaminophen, nitroGLYCERIN, ondansetron (ZOFRAN) IV, oxyCODONE, sodium chloride flush   Assessment/Plan   1. CAD: Anterior STEMI, suspect late presentation given several days of stuttering symptoms. She was taken to the cath lab, cath showed 90% left main, occluded LAD, 70% pLCx, 60% pRCA.  She had DES to left main and PTCA extensively in the LAD with diffuse 70% residual proximal LAD stenosis.  She has significant diffuse residual LAD disease, discussed with Dr. Burt Knack and not planned for relook cath at this time.  No chest pain.   - Continue ASA 81/ticagrelor.  - Atorvastatin 80 mg daily.  2. Acute systolic CHF/cardiogenic shock:  Ischemic cardiomyopathy.  Echo showed EF 20-25%, no LV thrombus, normal RV, IVC normal, trivial MR. This morning, CVP 6-7 with co-ox 61% , CI 2.1. AKI resolved.   - She is not on an inotrope, will leave off for now as she is symptomatically stable and not significantly volume overloaded. Pull swan today  - continue Farxiga 10 mg daily  - continue Entresto 24/26 bid, no BP room to increase.  - continue digoxin 0.125 daily - continue spironolactone 12.5 daily.  - no ? blocker w/ marginal co-ox and CI  - Would place Lifevest at discharge.  3. HTN: BP now is not elevated.  4. Smoking: She says she  has quit.  5. AKI: resolved, 1.5>>1.1 today.   6. Tooth abscess: Abscess with significant pain.   - Augmentin.  7. Hyperglycemia: Initially, but HgbA1c only 5.2.  8. DVT prophylaxis: Lovenox.     Length of Stay: [address], [place]  03/23/2022, 7:45 AM  Advanced Heart Failure Team Pager [phone] (M-F; 7a - 5p)  Please contact [facility] for night-coverage after hours (5p -7a ) and weekends on [email or web address]

## 2022-03-23 NOTE — TOC Benefit Eligibility Note (Signed)
Patient Teacher, English as a foreign language completed.    The patient is currently admitted and upon discharge could be taking Farxiga 10 mg.  The current 30 day co-pay is $0.00.   The patient is currently admitted and upon discharge could be taking Brilinta 90 mg.  The current 30 day co-pay is $0.00.   The patient is currently admitted and upon discharge could be taking Entresto 24-26 mg.  The current 30 day co-pay is $0.00.   The patient is insured through [place], [place] Patient Advocate Specialist [facility] Patient Advocate Team Direct Number: [phone]  Fax: [phone]

## 2022-03-24 DIAGNOSIS — I2102 ST elevation (STEMI) myocardial infarction involving left anterior descending coronary artery: Secondary | ICD-10-CM | POA: Diagnosis not present

## 2022-03-24 LAB — IRON AND TIBC
Iron: 31 ug/dL (ref 28–170)
Saturation Ratios: 9 % — ABNORMAL LOW (ref 10.4–31.8)
TIBC: 329 ug/dL (ref 250–450)
UIBC: 298 ug/dL

## 2022-03-24 LAB — BASIC METABOLIC PANEL
Anion gap: 10 (ref 5–15)
BUN: 15 mg/dL (ref 8–23)
CO2: 20 mmol/L — ABNORMAL LOW (ref 22–32)
Calcium: 9 mg/dL (ref 8.9–10.3)
Chloride: 107 mmol/L (ref 98–111)
Creatinine, Ser: 1.06 mg/dL — ABNORMAL HIGH (ref 0.44–1.00)
GFR, Estimated: 57 mL/min — ABNORMAL LOW (ref >60)
Glucose, Bld: 112 mg/dL — ABNORMAL HIGH (ref 70–99)
Potassium: 4 mmol/L (ref 3.5–5.1)
Sodium: 137 mmol/L (ref 135–145)

## 2022-03-24 LAB — CBC
HCT: 30.2 % — ABNORMAL LOW (ref 36.0–46.0)
Hemoglobin: 10.2 g/dL — ABNORMAL LOW (ref 12.0–15.0)
MCH: 32.3 pg (ref 26.0–34.0)
MCHC: 33.8 g/dL (ref 30.0–36.0)
MCV: 95.6 fL (ref 80.0–100.0)
Platelets: 191 10*3/uL (ref 150–400)
RBC: 3.16 MIL/uL — ABNORMAL LOW (ref 3.87–5.11)
RDW: 13.3 % (ref 11.5–15.5)
WBC: 9.6 10*3/uL (ref 4.0–10.5)
nRBC: 0 % (ref 0.0–0.2)

## 2022-03-24 LAB — GLUCOSE, CAPILLARY
Glucose-Capillary: 119 mg/dL — ABNORMAL HIGH (ref 70–99)
Glucose-Capillary: 125 mg/dL — ABNORMAL HIGH (ref 70–99)
Glucose-Capillary: 124 mg/dL — ABNORMAL HIGH (ref 70–99)

## 2022-03-24 LAB — FERRITIN: Ferritin: 68 ng/mL (ref 11–307)

## 2022-03-24 LAB — LIPOPROTEIN A (LPA): Lipoprotein (a): 326.7 nmol/L — ABNORMAL HIGH (ref <75.0)

## 2022-03-24 MED ORDER — SACUBITRIL-VALSARTAN 49-51 MG PO TABS
1.0000 | ORAL_TABLET | Freq: Two times a day (BID) | ORAL | Status: DC
  Administered 2022-03-24 – 2022-03-25 (×3): 1 via ORAL
  Filled 2022-03-24 (×4): qty 1

## 2022-03-24 MED ORDER — ORAL CARE MOUTH RINSE: 15.0000 mL | OROMUCOSAL | Status: DC | PRN

## 2022-03-24 NOTE — Progress Notes (Signed)
Patient ID: Rebecca Esparza, female   DOB: May 21, 1950, 72 y.o.   MRN: [id number]     Advanced Heart Failure Rounding Note  PCP-Cardiologist: None   Patient Profile 72 y/o female w/ h/o HTN and tobacco use, admitted for anterior STEMI c/b acute systolic heart failure/cardiogenic shock, suspect late presentation MI given several days of stuttering symptoms. Cath showed 90% left main, occluded LAD, 70% pLCx, 60% pRCA.  She had DES to left main and PTCA extensively in the LAD with diffuse 70% residual proximal LAD stenosis (medical management). EF 20-25%, no LV thrombus, normal RV. Stabilized w/ IABP and inotropes.   Subjective:    IABP discontinued 12/10     Stable off inotropes. Tolerating GDMT ok. Stable BP and renal fx.   Denies CP. No dyspnea. No complaints.    Objective:   Weight Range: 97.4 kg Body mass index is 38.05 kg/m.   Vital Signs:   Temp:  [97.8 F (36.6 C)-98.6 F (37 C)] 98.6 F (37 C) (02/13 0832) Pulse Rate:  [72-97] 90 (02/13 0800) Resp:  [9-29] 20 (02/13 0800) BP: (87-135)/(52-99) 94/66 (02/13 0800) SpO2:  [90 %-97 %] 97 % (02/13 0800) Weight:  [97.4 kg] 97.4 kg (02/13 0500) Last BM Date : 03/24/22  Weight change: Filed Weights   03/22/22 0600 03/23/22 0600 03/24/22 0500  Weight: 99.3 kg 97.7 kg 97.4 kg    Intake/Output:   Intake/Output Summary (Last 24 hours) at 03/24/2022 0917 Last data filed at 03/24/2022 0800 Gross per 24 hour  Intake 970 ml  Output 1550 ml  Net -580 ml      Physical Exam   General:  Well appearing. No respiratory difficulty HEENT: normal Neck: supple. JVD 7 cm. Carotids 2+ bilat; no bruits. No lymphadenopathy or thyromegaly appreciated. Cor: PMI nondisplaced. Regular rate & rhythm. No rubs, gallops or murmurs. Lungs: clear Abdomen: soft, nontender, nondistended. No hepatosplenomegaly. No bruits or masses. Good bowel sounds. Extremities: no cyanosis, clubbing, rash, edema Neuro: alert & oriented x 3, cranial nerves  grossly intact. moves all 4 extremities w/o difficulty. Affect pleasant.  Telemetry   NSR 90s (personally reviewed)   Labs    CBC Recent Labs    03/23/22 0524 03/24/22 0023  WBC 8.5 9.6  HGB 9.9* 10.2*  HCT 31.4* 30.2*  MCV 98.7 95.6  PLT 182 [id number]   Basic Metabolic Panel Recent Labs    03/23/22 0524 03/24/22 0023  NA 138 137  K 3.6 4.0  CL 108 107  CO2 22 20*  GLUCOSE 97 112*  BUN 18 15  CREATININE 1.12* 1.06*  CALCIUM 8.2* 9.0   Liver Function Tests No results for input(s): "AST", "ALT", "ALKPHOS", "BILITOT", "PROT", "ALBUMIN" in the last 72 hours.  No results for input(s): "LIPASE", "AMYLASE" in the last 72 hours. Cardiac Enzymes No results for input(s): "CKTOTAL", "CKMB", "CKMBINDEX", "TROPONINI" in the last 72 hours.  BNP: BNP (last 3 results) Recent Labs    03/20/22 1955  BNP 2,318.7*    ProBNP (last 3 results) No results for input(s): "PROBNP" in the last 8760 hours.   D-Dimer No results for input(s): "DDIMER" in the last 72 hours. Hemoglobin A1C No results for input(s): "HGBA1C" in the last 72 hours.  Fasting Lipid Panel No results for input(s): "CHOL", "HDL", "LDLCALC", "TRIG", "CHOLHDL", "LDLDIRECT" in the last 72 hours.  Thyroid Function Tests No results for input(s): "TSH", "T4TOTAL", "T3FREE", "THYROIDAB" in the last 72 hours.  Invalid input(s): "FREET3"  Other results:   Imaging  No results found.   Medications:     Scheduled Medications:  amoxicillin-clavulanate  1 tablet Oral Q12H   aspirin EC  81 mg Oral Daily   atorvastatin  80 mg Oral Daily   Chlorhexidine Gluconate Cloth  6 each Topical Daily   dapagliflozin propanediol  10 mg Oral Daily   digoxin  0.125 mg Oral Daily   enoxaparin (LOVENOX) injection  40 mg Subcutaneous Q24H   sacubitril-valsartan  1 tablet Oral BID   sodium chloride flush  3 mL Intravenous Q12H   spironolactone  12.5 mg Oral Daily   ticagrelor  90 mg Oral BID    Infusions:  sodium  chloride 10 mL/hr at 03/20/22 2106   sodium chloride      PRN Medications: sodium chloride, acetaminophen, nitroGLYCERIN, ondansetron (ZOFRAN) IV, mouth rinse, oxyCODONE, sodium chloride flush   Assessment/Plan   1. CAD: Anterior STEMI, suspect late presentation given several days of stuttering symptoms. She was taken to the cath lab, cath showed 90% left main, occluded LAD, 70% pLCx, 60% pRCA.  She had DES to left main and PTCA extensively in the LAD with diffuse 70% residual proximal LAD stenosis.  She has significant diffuse residual LAD disease, discussed with Dr. Burt Knack and not planned for relook cath at this time.  No chest pain.   - Continue ASA 81/ticagrelor.  - Atorvastatin 80 mg daily.  2. Acute systolic CHF/cardiogenic shock:  Ischemic cardiomyopathy.  Echo showed EF 20-25%, no LV thrombus, normal RV, IVC normal, trivial MR. Shock now resolved. Stable off IABP and off inotropes. Tolerating GDMT ok w/ stable BP and renal fx. Euvolemic on exam  - continue Farxiga 10 mg daily  - increase Entresto to 49-51 mg bid - continue digoxin 0.125 daily - continue spironolactone 12.5 daily.  - no ? blocker yet w/ recent shock  - Plan Lifevest at discharge. Order placed  3. HTN: BP now is not elevated.  - GDMT per above  4. Smoking: She says she has quit.  5. AKI: resolved, 1.5>>1.0 today.   6. Tooth abscess: Abscess with significant pain.   - Continue Augmentin.  7. Hyperglycemia: Initially, but HgbA1c only 5.2.  8. DVT prophylaxis: Lovenox.   Will transfer to floor today.   Length of Stay: [address], [place]  03/24/2022, 9:17 AM  Advanced Heart Failure Team Pager [phone] (M-F; 7a - 5p)  Please contact [facility] for night-coverage after hours (5p -7a ) and weekends on [email or web address]

## 2022-03-24 NOTE — TOC CM/SW Note (Signed)
Patient was approved for Life Vest. She will be fitted today. [place], Heart Failure TOC CM [phone]

## 2022-03-24 NOTE — Progress Notes (Signed)
Pt declined ambulation despite verbalizing importance d/t wanting to comb hair and "look good" while waking. Pt received HF book and was educated on LE edema and other concerning s/s, recording daily weights and when to call provider, sodium reduction (printout provided), importance of taking meds and physical activity, smoking cessation, heart healthy diet, and fluid restriction.   When discussing diet, pt verbalized that all foods are lacking nutrients d/t being sprayed and her MI resulted d/t her eating these foods.   Pt agreeable to smoking cessation, pt believes that tobacco is also being sprayed.  Pt interested in CRPII, pt does not drive so we will have to set up service if possible.   Christen Bame 03/24/2022 11:13 AM   [phone]

## 2022-03-24 NOTE — TOC Initial Note (Signed)
Transition of Care [facility]) - Initial/Assessment Note    Patient Details  Name: Rebecca Esparza MRN: [id number] Date of Birth: 1950-11-26  Transition of [facility]) CM/SW Contact:    Erenest Rasher, RN Phone Number: [phone] 03/24/2022, 3:16 PM  Clinical Narrative:                  Spoke to pt and states she lives alone. She was independent pta. States she drives to appts. She has scale at home for daily weights. Life Vest was approved and will be fitted tonight at 6 pm.     Expected Discharge Plan: Home/Self Care Barriers to Discharge: Continued Medical Work up   Patient Goals and CMS Choice Patient states their goals for this hospitalization and ongoing recovery are:: wants to get better CMS Medicare.gov Compare Post Acute Care list provided to:: Patient        Expected Discharge Plan and Services   Discharge Planning Services: CM Consult   Living arrangements for the past 2 months: Apartment                                      Prior Living Arrangements/Services Living arrangements for the past 2 months: Apartment Lives with:: Self Patient language and need for interpreter reviewed:: Yes        Need for Family Participation in Patient Care: No (Comment) Care giver support system in place?: No (comment)   Criminal Activity/Legal Involvement Pertinent to Current Situation/Hospitalization: No - Comment as needed  Activities of Daily Living      Permission Sought/Granted Permission sought to share information with : Case Manager, Family Supports, PCP Permission granted to share information with : Yes, Verbal Permission Granted  Share Information with NAME: Gevena Cotton     Permission granted to share info w Relationship: mother  Permission granted to share info w Contact Information: [phone]  Emotional Assessment Appearance:: Appears stated age Attitude/Demeanor/Rapport: Engaged Affect (typically observed): Accepting Orientation: :  Oriented to Self, Oriented to Place, Oriented to  Time, Oriented to Situation   Psych Involvement: No (comment)  Admission diagnosis:  STEMI (ST elevation myocardial infarction) ([place]) [I21.3] ST elevation myocardial infarction (STEMI), unspecified artery ([place]) [I21.3] Acute on chronic congestive heart failure, unspecified heart failure type ([place]) [I50.9] STEMI involving left anterior descending coronary artery ([place]) [I21.02] Patient Active Problem List   Diagnosis Date Noted   STEMI involving left anterior descending coronary artery ([place]) 03/20/2022   PCP:  Patient, No Pcp Per Pharmacy:   [facility] [place], [place] [address] Phone: [phone] Fax: [phone]  [id number] [address] [place] [id number] [id number]: [phone]: [phone]    Social Determinants of Health (SDOH) Social History: SDOH Screenings   Tobacco Use: High Risk (03/23/2022)   SDOH Interventions:     Readmission Risk Interventions     No data to display

## 2022-03-24 NOTE — Discharge Summary (Signed)
Advanced Heart Failure Team  Discharge Summary   Patient ID: Rebecca Esparza MRN: [id number], DOB/AGE: Jun 14, 1950 72 y.o. Admit date: 03/20/2022 D/C date:     03/27/2022   Primary Discharge Diagnoses:  CAD/ Anterior STEMI Acute Systolic Heart Failure>>Cardiogenic Shock  Ischemic Cardiomyopathy  AKI  Hypertension  AKI Hyperglycemia Tooth Abscess   Hospital Course:  72 y/o female w/ a history of HTN and smoking, was not on any cardiac meds at home. She reports about 4 days of chest pain and dyspnea prior to admission. Initially, CP was on and off, then became persistent. She was short of breath walking around the house and orthopneic. On initial arrival to the ER, she was hypertensive with tachycardia and hypoxemia. CXR with pulmonary edema. ECG with anteroseptal and lateral ST elevation. She was taken to the cath lab, cath showed 90% left main, occluded LAD, 70% pLCx, 60% pRCA. She had DES to left main and PTCA extensively in the LAD with diffuse 70% residual proximal LAD stenosis. RHC showed significantly elevated right and left heart filling pressures, IABP was placed and patient received IV Lasix.  Echo showed EF 20-25%, normal RV size but systolic fx not well visualized. AHF team was consulted for further management.   She diuresed w/ IV Lasix and shock improved. IABP removed and pt was weaned off inotrope/pressor support w/ stable co-ox. Volume status improved w/ diuresis. AKI resolved. GDMT added and tolerated well w/ stable BP and renal fx. Also treated w/ ASA, Brilinta and high intensity statin for CAD. LDL 133. Hgb A1c 5.3. Transferred out of ICU to PCU. Fitted w/ LifeVest prior to d/c.   Of note, she was also placed on Augmentin for treatment of tooth abscess and will f/u w/ her dentist post d/c.   Pt was last seen and examined by Dr. Daniel Nones and felt stable for d/c home.  Post hospital f/u arranged in the [facility]. Referred to cardiac rehab. Paramedicine to see once discharged. Meds  sent to [place].    Physical exam:  General:  well appearing.  No respiratory difficulty HEENT: normal Neck: supple. No JVD. Carotids 2+ bilat; no bruits. No lymphadenopathy or thyromegaly appreciated. Cor: PMI nondisplaced. Regular rate & rhythm. No rubs, gallops or murmurs. Lungs: clear Abdomen: soft, nontender, nondistended. No hepatosplenomegaly. No bruits or masses. Good bowel sounds. Extremities: no cyanosis, clubbing, rash, non-pitting BLE edema  Neuro: alert & oriented x 3, cranial nerves grossly intact. moves all 4 extremities w/o difficulty. Affect pleasant.  Tele: NSR 90s (Personally reviewed)    See below for detailed problem list:  1. CAD: Anterior STEMI, suspect late presentation given several days of stuttering symptoms. She was taken to the cath lab, cath showed 90% left main, occluded LAD, 70% pLCx, 60% pRCA.  She had DES to left main and PTCA extensively in the LAD with diffuse 70% residual proximal LAD stenosis.  She has significant diffuse residual LAD disease, discussed with Dr. Burt Knack and not planned for relook cath at this time.  No chest pain.   - Continue ASA 81/ticagrelor.  - Atorvastatin 80 mg daily.  2. Acute systolic CHF/cardiogenic shock:  Ischemic cardiomyopathy.  Echo showed EF 20-25%, no LV thrombus, normal RV, IVC normal, trivial MR. Shock now resolved. Stable off IABP and off inotropes. Euvolemic on exam  - Continue Farxiga 10 mg daily  - Now on Entresto 24-26 mg BID 2/2 hypotension on higher dose - Continue digoxin 0.125 daily - Continue spironolactone 12.5>25 daily.  - no ? blocker  yet w/ recent shock and soft BP  - Lifevest at discharge. Has at bedside 3. HTN: BP now normotensive - GDMT per above  4. Smoking: She says she has quit.  5. AKI: resolved, 1.5>>1.0 today.   6. Tooth abscess: Abscess with significant pain.   - Continue Augmentin.  7. Hyperglycemia: Initially, but HgbA1c only 5.2.  8. DVT prophylaxis: Lovenox.  9. IDA: Ferritin 68.  Tsat 9% - got IV Fe   Discharge Weight Range: 97.4kg  Discharge Vitals: Blood pressure 115/75, pulse 96, temperature 97.9 F (36.6 C), temperature source Oral, resp. rate 18, height 5' 2.99" (1.6 m), weight 97.4 kg, SpO2 98 %.  Labs: Lab Results  Component Value Date   WBC 14.0 (H) 03/27/2022   HGB 10.7 (L) 03/27/2022   HCT 32.2 (L) 03/27/2022   MCV 94.4 03/27/2022   PLT 306 03/27/2022    Recent Labs  Lab 03/20/22 1955 03/20/22 2317 03/27/22 0259  NA 136   < > 138  K 4.1   < > 3.9  CL 105   < > 108  CO2 15*   < > 20*  BUN 12   < > 10  CREATININE 1.48*   < > 1.02*  CALCIUM 8.6*   < > 9.4  PROT 6.6  --   --   BILITOT 1.0  --   --   ALKPHOS 89  --   --   ALT 35  --   --   AST 53*  --   --   GLUCOSE 475*   < > 116*   < > = values in this interval not displayed.   Lab Results  Component Value Date   CHOL 206 (H) 03/21/2022   HDL 53 03/21/2022   LDLCALC 133 (H) 03/21/2022   TRIG 102 03/21/2022   BNP (last 3 results) Recent Labs    03/20/22 1955  BNP 2,318.7*   Diagnostic Studies/Procedures  LHC 03/20/22   Mid LM to Dist LM lesion is 90% stenosed.   Ost LAD to Mid LAD lesion is 100% stenosed.   Ost Cx to Prox Cx lesion is 70% stenosed.   Prox RCA lesion is 60% stenosed.   Mid RCA to Dist RCA lesion is 50% stenosed.   A drug-eluting stent was successfully placed using a STENT MEGATRON 4.0X12.   Balloon angioplasty was performed using a BALLN EMERGE MR 2.0X20.   Post intervention, there is a 0% residual stenosis.   Post intervention, there is a 70% residual stenosis.   LV end diastolic pressure is severely elevated.   1.  Acute anterior STEMI secondary to proximal LAD occlusion, treated with extensive PTCA with restoration of TIMI-3 flow but severe diffuse residual stenosis 2.  Severe left main stenosis treated successfully with a 4.0 x 12 mm Synergy Megatron DES 3.  Moderate ostial circumflex stenosis with TIMI-3 flow in the vessel 4.  Moderate diffuse RCA  stenosis involving a tortuous vessel with no severe disease identified 5.  Acute systolic heart failure with pulmonary edema/respiratory failure, treated with IV diuresis and intra-aortic balloon pump insertion 6.  Severely elevated wedge pressure mean 33 mmHg   Diagnostic Dominance: Right  Intervention    2D Echo 03/21/22 1. Left ventricular ejection fraction, by estimation, is 20 to 25%. The  left ventricle has severely decreased function. The left ventricle  demonstrates regional wall motion abnormalities (see scoring  diagram/findings for description). The left  ventricular internal cavity size was mildly dilated. There is mild  left  ventricular hypertrophy. Left ventricular diastolic parameters are  consistent with Grade I diastolic dysfunction (impaired relaxation).   2. Right ventricular systolic function was not well visualized. The right  ventricular size is normal. There is normal pulmonary artery systolic  pressure. The estimated right ventricular systolic pressure is 99991111 mmHg.   3. The mitral valve is normal in structure. Trivial mitral valve  regurgitation. No evidence of mitral stenosis.   4. The aortic valve is tricuspid. Aortic valve regurgitation is not  visualized. No aortic stenosis is present.   5. The inferior vena cava is normal in size with greater than 50%  respiratory variability, suggesting right atrial pressure of 3 mmHg.   Discharge Medications   Allergies as of 03/27/2022   No Known Allergies      Medication List     TAKE these medications    amoxicillin-clavulanate 875-125 MG tablet Commonly known as: AUGMENTIN Take 1 tablet by mouth every 12 (twelve) hours. Last day 04/04/22   aspirin EC 81 MG tablet Take 1 tablet (81 mg total) by mouth daily. Swallow whole.   atorvastatin 80 MG tablet Commonly known as: LIPITOR Take 1 tablet (80 mg total) by mouth daily.   dapagliflozin propanediol 10 MG Tabs tablet Commonly known as: FARXIGA Take 1  tablet (10 mg total) by mouth daily.   digoxin 0.125 MG tablet Commonly known as: LANOXIN Take 1 tablet (0.125 mg total) by mouth daily.   nitroGLYCERIN 0.4 MG SL tablet Commonly known as: NITROSTAT Place 1 tablet (0.4 mg total) under the tongue every 5 (five) minutes x 3 doses as needed for chest pain.   pantoprazole 40 MG tablet Commonly known as: PROTONIX Take 1 tablet (40 mg total) by mouth daily.   sacubitril-valsartan 24-26 MG Commonly known as: ENTRESTO Take 1 tablet by mouth 2 (two) times daily.   spironolactone 25 MG tablet Commonly known as: ALDACTONE Take 1 tablet (25 mg total) by mouth daily.   ticagrelor 90 MG Tabs tablet Commonly known as: BRILINTA Take 1 tablet (90 mg total) by mouth 2 (two) times daily.               Durable Medical Equipment  (From admission, onward)           Start     Ordered   03/23/22 1140  For home use only DME Vest life vest  Once       Comments: ZOLL Life Vest   Identify indication. Type yes or no  Cardiac Arrest due to VF or sustained VT: No  Familial or inherited condition with SCA risk: No  MI with an EF < 35% or DCM with an EF < 35%: Yes  ICD Explanation: No  Other condition with high risk of VT/VF:   Life Vest Setting  VT 150 bpm VF 200 BPM 150Jx5 Length of need: 3 months  Start Date: 03/25/22   03/23/22 1141            Disposition   The patient will be discharged in stable condition to home. Discharge Instructions     Amb Referral to Cardiac Rehabilitation   Complete by: As directed    Diagnosis:  Heart Failure (see criteria below if ordering Phase II) STEMI Coronary Stents     Heart Failure Type: Chronic Systolic   After initial evaluation and assessments completed: Virtual Based Care may be provided alone or in conjunction with Phase 2 Cardiac Rehab based on patient barriers.: Yes   Intensive Cardiac  Rehabilitation (ICR) [place] location only OR Traditional Cardiac Rehabilitation (TCR) *If  criteria for ICR are not met will enroll in TCR [facility] only): Yes       Follow-up [facility] [place] Follow up.   Specialty: Cardiology Why: 04/14/22 at 1:30 PM   Hospital Follow-up for Heart Failure at the [facility] at [facility], [place] information: [address] [id number] [place] [phone]                  Duration of Discharge Encounter: Greater than 35 minutes   Signed, Earnie Larsson AGACNP-BC  03/27/2022, 9:16 AM

## 2022-03-25 ENCOUNTER — Telehealth (HOSPITAL_COMMUNITY): Payer: Self-pay | Admitting: Licensed Clinical Social Worker

## 2022-03-25 DIAGNOSIS — I5022 Chronic systolic (congestive) heart failure: Secondary | ICD-10-CM

## 2022-03-25 DIAGNOSIS — I2102 ST elevation (STEMI) myocardial infarction involving left anterior descending coronary artery: Secondary | ICD-10-CM | POA: Diagnosis not present

## 2022-03-25 LAB — CBC
HCT: 31.6 % — ABNORMAL LOW (ref 36.0–46.0)
Hemoglobin: 10.5 g/dL — ABNORMAL LOW (ref 12.0–15.0)
MCH: 31.3 pg (ref 26.0–34.0)
MCHC: 33.2 g/dL (ref 30.0–36.0)
MCV: 94 fL (ref 80.0–100.0)
Platelets: 232 10*3/uL (ref 150–400)
RBC: 3.36 MIL/uL — ABNORMAL LOW (ref 3.87–5.11)
RDW: 13.2 % (ref 11.5–15.5)
WBC: 9.1 10*3/uL (ref 4.0–10.5)
nRBC: 0 % (ref 0.0–0.2)

## 2022-03-25 LAB — BASIC METABOLIC PANEL
Anion gap: 10 (ref 5–15)
BUN: 11 mg/dL (ref 8–23)
CO2: 19 mmol/L — ABNORMAL LOW (ref 22–32)
Calcium: 9 mg/dL (ref 8.9–10.3)
Chloride: 107 mmol/L (ref 98–111)
Creatinine, Ser: 1 mg/dL (ref 0.44–1.00)
GFR, Estimated: >60 mL/min (ref >60)
Glucose, Bld: 118 mg/dL — ABNORMAL HIGH (ref 70–99)
Potassium: 3.7 mmol/L (ref 3.5–5.1)
Sodium: 136 mmol/L (ref 135–145)

## 2022-03-25 MED ORDER — IRON SUCROSE 500 MG IVPB - SIMPLE MED
500.0000 mg | Freq: Once | INTRAVENOUS | Status: AC
  Administered 2022-03-25: 500 mg via INTRAVENOUS
  Filled 2022-03-25: qty 275

## 2022-03-25 MED ORDER — SACUBITRIL-VALSARTAN 24-26 MG PO TABS
1.0000 | ORAL_TABLET | Freq: Two times a day (BID) | ORAL | Status: DC
  Administered 2022-03-25 – 2022-03-28 (×6): 1 via ORAL
  Filled 2022-03-25 (×6): qty 1

## 2022-03-25 MED ORDER — POLYVINYL ALCOHOL 1.4 % OP SOLN
1.0000 [drp] | OPHTHALMIC | Status: DC | PRN
  Administered 2022-03-25: 1 [drp] via OPHTHALMIC
  Filled 2022-03-25: qty 15

## 2022-03-25 MED ORDER — PANTOPRAZOLE SODIUM 40 MG PO TBEC
40.0000 mg | DELAYED_RELEASE_TABLET | Freq: Every day | ORAL | Status: DC
  Administered 2022-03-25 – 2022-03-28 (×4): 40 mg via ORAL
  Filled 2022-03-25 (×4): qty 1

## 2022-03-25 NOTE — Telephone Encounter (Signed)
H&V Care Navigation CSW Progress Note  Clinical Social Worker consulted by Heart Failure team to help figure out transportation to get patient to Cardiac Rehab appts.  Pt is eligible for [facility] case management services so CSW placed order for them to assess for transportation options and reach out to patient to discuss and help arrange for cardiac rehab.     SDOH Screenings   Transportation Needs: Unmet Transportation Needs (03/25/2022)  Tobacco Use: High Risk (03/23/2022)     CSW will continue to follow and assist as needed  Jorge Ny, [facility] Desk#: [phone] Cell#: [phone]

## 2022-03-25 NOTE — Progress Notes (Signed)
Pt asked to ambulate this morning. Patient walked to elevator and back to room with this RN. On the way back to room pt states she feels short of breath and a bit lightheaded. She says she feels like she is leaning to the right side. HR increased to 120's during ambulation. Returned pt back to bed

## 2022-03-25 NOTE — Progress Notes (Addendum)
Patient ID: Rebecca Esparza, female   DOB: 1950/12/19, 72 y.o.   MRN: [id number]     Advanced Heart Failure Rounding Note  PCP-Cardiologist: None   Patient Profile 72 y/o female w/ h/o HTN and tobacco use, admitted for anterior STEMI c/b acute systolic heart failure/cardiogenic shock, suspect late presentation MI given several days of stuttering symptoms. Cath showed 90% left main, occluded LAD, 70% pLCx, 60% pRCA.  She had DES to left main and PTCA extensively in the LAD with diffuse 70% residual proximal LAD stenosis (medical management). EF 20-25%, no LV thrombus, normal RV. Stabilized w/ IABP and inotropes.   Subjective:    IABP discontinued 12/10     On GDMT. Got a little lightheaded yesterday w/ ambulation. Feels better today. No dyspnea.   Labs c/w IDA.   LifeVest has been delivered.   Objective:   Weight Range: 97.4 kg Body mass index is 38.05 kg/m.   Vital Signs:   Temp:  [97.2 F (36.2 C)-98.3 F (36.8 C)] 97.7 F (36.5 C) (02/14 0825) Pulse Rate:  [80-103] 81 (02/14 0825) Resp:  [13-24] 16 (02/14 0825) BP: (98-134)/(53-88) 129/71 (02/14 0825) SpO2:  [94 %-99 %] 98 % (02/14 0825) Last BM Date : 03/24/22  Weight change: Filed Weights   03/22/22 0600 03/23/22 0600 03/24/22 0500  Weight: 99.3 kg 97.7 kg 97.4 kg    Intake/Output:   Intake/Output Summary (Last 24 hours) at 03/25/2022 0910 Last data filed at 03/24/2022 2200 Gross per 24 hour  Intake 720 ml  Output 200 ml  Net 520 ml      Physical Exam   General:  Well appearing. No respiratory difficulty HEENT: normal Neck: supple. no JVD. Carotids 2+ bilat; no bruits. No lymphadenopathy or thyromegaly appreciated. Cor: PMI nondisplaced. Regular rate & rhythm. No rubs, gallops or murmurs. Lungs: clear Abdomen: soft, nontender, nondistended. No hepatosplenomegaly. No bruits or masses. Good bowel sounds. Extremities: no cyanosis, clubbing, rash, edema Neuro: alert & oriented x 3, cranial nerves grossly  intact. moves all 4 extremities w/o difficulty. Affect pleasant.   Telemetry   NSR 80s (personally reviewed)   Labs    CBC Recent Labs    03/24/22 0023 03/25/22 0225  WBC 9.6 9.1  HGB 10.2* 10.5*  HCT 30.2* 31.6*  MCV 95.6 94.0  PLT 191 [id number]   Basic Metabolic Panel Recent Labs    03/24/22 0023 03/25/22 0225  NA 137 136  K 4.0 3.7  CL 107 107  CO2 20* 19*  GLUCOSE 112* 118*  BUN 15 11  CREATININE 1.06* 1.00  CALCIUM 9.0 9.0   Liver Function Tests No results for input(s): "AST", "ALT", "ALKPHOS", "BILITOT", "PROT", "ALBUMIN" in the last 72 hours.  No results for input(s): "LIPASE", "AMYLASE" in the last 72 hours. Cardiac Enzymes No results for input(s): "CKTOTAL", "CKMB", "CKMBINDEX", "TROPONINI" in the last 72 hours.  BNP: BNP (last 3 results) Recent Labs    03/20/22 1955  BNP 2,318.7*    ProBNP (last 3 results) No results for input(s): "PROBNP" in the last 8760 hours.   D-Dimer No results for input(s): "DDIMER" in the last 72 hours. Hemoglobin A1C No results for input(s): "HGBA1C" in the last 72 hours.  Fasting Lipid Panel No results for input(s): "CHOL", "HDL", "LDLCALC", "TRIG", "CHOLHDL", "LDLDIRECT" in the last 72 hours.  Thyroid Function Tests No results for input(s): "TSH", "T4TOTAL", "T3FREE", "THYROIDAB" in the last 72 hours.  Invalid input(s): "FREET3"  Other results:   Imaging    No results found.  Medications:     Scheduled Medications:  amoxicillin-clavulanate  1 tablet Oral Q12H   aspirin EC  81 mg Oral Daily   atorvastatin  80 mg Oral Daily   dapagliflozin propanediol  10 mg Oral Daily   digoxin  0.125 mg Oral Daily   enoxaparin (LOVENOX) injection  40 mg Subcutaneous Q24H   sacubitril-valsartan  1 tablet Oral BID   sodium chloride flush  3 mL Intravenous Q12H   spironolactone  12.5 mg Oral Daily   ticagrelor  90 mg Oral BID    Infusions:  sodium chloride Stopped (03/24/22 0600)   sodium chloride      PRN  Medications: sodium chloride, acetaminophen, nitroGLYCERIN, ondansetron (ZOFRAN) IV, mouth rinse, oxyCODONE, sodium chloride flush   Assessment/Plan   1. CAD: Anterior STEMI, suspect late presentation given several days of stuttering symptoms. She was taken to the cath lab, cath showed 90% left main, occluded LAD, 70% pLCx, 60% pRCA.  She had DES to left main and PTCA extensively in the LAD with diffuse 70% residual proximal LAD stenosis.  She has significant diffuse residual LAD disease, discussed with Dr. Burt Knack and not planned for relook cath at this time.  No chest pain.   - Continue ASA 81/ticagrelor.  - Atorvastatin 80 mg daily.  2. Acute systolic CHF/cardiogenic shock:  Ischemic cardiomyopathy.  Echo showed EF 20-25%, no LV thrombus, normal RV, IVC normal, trivial MR. Shock now resolved. Stable off IABP and off inotropes. Euvolemic on exam  - continue Farxiga 10 mg daily  - continue Entresto 49-51 mg bid - continue digoxin 0.125 daily - continue spironolactone 12.5 daily.  - no ? blocker yet w/ recent shock and soft BP  - Lifevest at discharge.  3. HTN: BP now is not elevated.  - GDMT per above  4. Smoking: She says she has quit.  5. AKI: resolved, 1.5>>1.0 today.   6. Tooth abscess: Abscess with significant pain.   - Continue Augmentin.  7. Hyperglycemia: Initially, but HgbA1c only 5.2.  8. DVT prophylaxis: Lovenox.  9. IDA: Ferritin 68. Tsat 9% - give IV Fe   No GDMT titration today given soft BP and recent orthostasis. Will keep today. If improved, plan possible d/c home tomorrow. May need to reduce Entresto dose if symptoms continue.   Length of Stay: [address], PA-C  03/25/2022, 9:10 AM  Advanced Heart Failure Team Pager [phone] (M-F; 7a - 5p)  Please contact [facility] for night-coverage after hours (5p -7a ) and weekends on [email or web address]

## 2022-03-25 NOTE — Evaluation (Signed)
Physical Therapy Evaluation & Discharge Patient Details Name: Rebecca Esparza MRN: [id number] DOB: [birth year] Today's Date: 03/25/2022  History of Present Illness  Pt is a 72 y.o. female who presented 03/20/22 with several days of shortness of breath with associated chest tightness and EKG findings concerning for STEMI. PMH: HTN, tobacco use   Clinical Impression  Pt presents with condition above. PTA, she was independent and living alone in a 1-level apartment on the 8th floor, in which she takes the elevator to access. Evaluated pt in conjunction with pt participating in cardiac rehab due to imminent discharge PT orders. Pt appears to be functioning close to her baseline with no overt LOB or need for UE support or physical assistance with functional mobility. Pt does report and demonstrate deficits in endurance though. Will defer pt to mobilize with nursing staff, mobility techs, and cardiac rehab to address this deficit. No further acute PT needs identified. All education completed and questions answered. PT will sign off. Recommend cardiac rehab at d/c.     Recommendations for follow up therapy are one component of a multi-disciplinary discharge planning process, led by the attending physician.  Recommendations may be updated based on patient status, additional functional criteria and insurance authorization.  Follow Up Recommendations Other (comment) (cardiac rehab)      Assistance Recommended at Discharge None  Patient can return home with the following  Assist for transportation    Equipment Recommendations None recommended by PT  Recommendations for Other Services       Functional Status Assessment Patient has not had a recent decline in their functional status     Precautions / Restrictions Precautions Precautions: Other (comment) Precaution Comments: watch HR Restrictions Weight Bearing Restrictions: No      Mobility  Bed Mobility               General bed  mobility comments: Pt sitting EOB upon arrival.    Transfers Overall transfer level: Modified independent Equipment used: None               General transfer comment: Able to come to stand from EOB without assistance or LOB    Ambulation/Gait Ambulation/Gait assistance: Modified independent (Device/Increase time) Gait Distance (Feet): 300 Feet Assistive device: None Gait Pattern/deviations: Step-through pattern, Decreased stride length Gait velocity: reduced Gait velocity interpretation: 1.31 - 2.62 ft/sec, indicative of limited community ambulator   General Gait Details: Pt with slightly slowed but steady gait with no LOB, even when changing head positions or speeds. Pt cued to take x2 standing rest breaks when appeared fatigued or SOB.  Stairs            Wheelchair Mobility    Modified Rankin (Stroke Patients Only)       Balance Overall balance assessment: No apparent balance deficits (not formally assessed)                                           Pertinent Vitals/Pain Pain Assessment Pain Assessment: Faces Faces Pain Scale: No hurt Pain Intervention(s): Monitored during session    Home Living Family/patient expects to be discharged to:: Private residence Living Arrangements: Alone   Type of Home: Apartment (8th floor) Home Access: Elevator       Home Layout: One level Home Equipment: Grab bars - tub/shower      Prior Function Prior Level of Function : Independent/Modified Independent  Mobility Comments: No AD.       Hand Dominance        Extremity/Trunk Assessment   Upper Extremity Assessment Upper Extremity Assessment: Overall WFL for tasks assessed    Lower Extremity Assessment Lower Extremity Assessment: Overall WFL for tasks assessed    Cervical / Trunk Assessment Cervical / Trunk Assessment: Normal  Communication   Communication: No difficulties  Cognition Arousal/Alertness:  Awake/alert Behavior During Therapy: WFL for tasks assessed/performed Overall Cognitive Status: Within Functional Limits for tasks assessed                                          General Comments General comments (skin integrity, edema, etc.): HR up to 120s with gait; educated pt on increased mobility frequency and limiting sodium intake    Exercises     Assessment/Plan    PT Assessment Patient does not need any further PT services  PT Problem List         PT Treatment Interventions      PT Goals (Current goals can be found in the Care Plan section)  Acute Rehab PT Goals Patient Stated Goal: to improve her endurance PT Goal Formulation: All assessment and education complete, DC therapy Time For Goal Achievement: 03/26/22 Potential to Achieve Goals: Good    Frequency       Co-evaluation               AM-PAC PT "6 Clicks" Mobility  Outcome Measure Help needed turning from your back to your side while in a flat bed without using bedrails?: None Help needed moving from lying on your back to sitting on the side of a flat bed without using bedrails?: None Help needed moving to and from a bed to a chair (including a wheelchair)?: None Help needed standing up from a chair using your arms (e.g., wheelchair or bedside chair)?: None Help needed to walk in hospital room?: None Help needed climbing 3-5 steps with a railing? : A Little 6 Click Score: 23    End of Session   Activity Tolerance: Patient tolerated treatment well Patient left: in chair;Other (comment) (with cardiac rehab) Nurse Communication: Mobility status PT Visit Diagnosis: Other abnormalities of gait and mobility (R26.89)    Time: [id number] PT Time Calculation (min) (ACUTE ONLY): 9 min   Charges:   PT Evaluation $PT Eval Low Complexity: 1 Low          Moishe Spice, PT, DPT Acute Rehabilitation Services  Office: [place] 03/25/2022, 1:49 PM

## 2022-03-25 NOTE — Care Management Important Message (Signed)
Important Message  Patient Details  Name: Rebecca Esparza MRN: [id number] Date of Birth: 01/14/1951   Medicare Important Message Given:  Yes     Brier Firebaugh [address] 03/25/2022, 2:34 PM

## 2022-03-25 NOTE — Progress Notes (Signed)
CARDIAC REHAB PHASE I   PRE:  Rate/Rhythm: 91 SR    BP: sitting 125/74    SaO2: 98 RA  MODE:  Ambulation: 300 ft   POST:  Rate/Rhythm: 132 ST    BP: sitting 125/114     SaO2: 97 RA  Pt asleep on arrival. Able to get up and walk independently however increased HR and fatigue with distance. Rest x2. To recliner. Pt is apprehensive [id number] home when she feels like activity is exhausting.    Discussed MI book, plaque rupture, the importance of Brilinta, and smoking cessation tips. Pt sts she is done smoking and will not let others smoke in her house. She is interested in CRPII after she recovers more. She wants to stay in the hospital until she feels she does not get as fatigued. Encouraged being out of bed and ambulation. Would benefit from MT.  [id number]  Yves Dill BS, ACSM-CEP 03/25/2022 2:10 PM

## 2022-03-26 ENCOUNTER — Telehealth: Payer: Self-pay | Admitting: *Deleted

## 2022-03-26 DIAGNOSIS — I2102 ST elevation (STEMI) myocardial infarction involving left anterior descending coronary artery: Secondary | ICD-10-CM | POA: Diagnosis not present

## 2022-03-26 LAB — BASIC METABOLIC PANEL
Anion gap: 12 (ref 5–15)
BUN: 10 mg/dL (ref 8–23)
CO2: 19 mmol/L — ABNORMAL LOW (ref 22–32)
Calcium: 8.8 mg/dL — ABNORMAL LOW (ref 8.9–10.3)
Chloride: 107 mmol/L (ref 98–111)
Creatinine, Ser: 1.01 mg/dL — ABNORMAL HIGH (ref 0.44–1.00)
GFR, Estimated: 60 mL/min — ABNORMAL LOW (ref >60)
Glucose, Bld: 129 mg/dL — ABNORMAL HIGH (ref 70–99)
Potassium: 3.4 mmol/L — ABNORMAL LOW (ref 3.5–5.1)
Sodium: 138 mmol/L (ref 135–145)

## 2022-03-26 LAB — CBC
HCT: 31 % — ABNORMAL LOW (ref 36.0–46.0)
Hemoglobin: 10.6 g/dL — ABNORMAL LOW (ref 12.0–15.0)
MCH: 31.9 pg (ref 26.0–34.0)
MCHC: 34.2 g/dL (ref 30.0–36.0)
MCV: 93.4 fL (ref 80.0–100.0)
Platelets: 250 10*3/uL (ref 150–400)
RBC: 3.32 MIL/uL — ABNORMAL LOW (ref 3.87–5.11)
RDW: 13.3 % (ref 11.5–15.5)
WBC: 9.1 10*3/uL (ref 4.0–10.5)
nRBC: 0 % (ref 0.0–0.2)

## 2022-03-26 MED ORDER — SPIRONOLACTONE 25 MG PO TABS
25.0000 mg | ORAL_TABLET | Freq: Every day | ORAL | Status: DC
  Administered 2022-03-26 – 2022-03-28 (×3): 25 mg via ORAL
  Filled 2022-03-26 (×3): qty 1

## 2022-03-26 NOTE — Progress Notes (Signed)
Pt experienced episodes of heart rates reaching 120's.

## 2022-03-26 NOTE — Progress Notes (Signed)
CARDIAC REHAB PHASE I   PRE:  Rate/Rhythm: 99 NSR  BP:  Sitting: 147/84      SaO2: 96 RA  MODE:  Ambulation: 75 ft   AD:  None  POST:  Rate/Rhythm: 126 ST  BP:  Sitting: 99/62      SaO2: 90-96 RA  Pt amb with standby assistance, pt denies CP and reports moderate SOB and 4/10 dizziness that was relieved with rest. Pt denies questions from yesterdays teachings.    Christen Bame  9:19 AM 03/26/2022    Service time is from 0855 to [phone].

## 2022-03-26 NOTE — Progress Notes (Signed)
Heart and Vascular Care Navigation  03/26/2022  Rebecca Esparza [birth year] [id number]  Reason for Referral: paramedicine enrollment   Engaged with patient face to face for initial visit for Heart and Vascular Care Coordination.                                                                                                   Assessment:   Outpatient HF CSW met with pt to discuss enrollment in [employer].  Pt agreeable to enrollment to help prevent readmission.  Paramedicine Initial Assessment:  Housing:  In what kind of housing do you live? House/apt/trailer/shelter? Lives in [place] you rent/pay a mortgage/own? Rent which includes utilities- no concerns about affording at this time  Do you live with anyone? self  Social:  What is your current marital status? unmarried  Do you have any children? no  Do you have family or friends who live locally? Elderly mom (92) and two nieces- both added to emergency contacts  Food:  No concerns expressed- has U card through insurance that she uses to pay for food as well as food stamps.  Income:  What is your current source of income? Retirement income  Insurance:  Are you currently insured?  UHC Medicare and Medicaid  Do you have prescription coverage? yes  Transportation:  Do you have transportation to your medical appointments? No- has a car but currently broken- needs help with transport- consult placed to [facility] to assist with transportation    Daily Health Needs: Do you have a working scale at home? Yes- has a talking scale  How do you manage your medications at home? States she wasn't taking medications prior to admission- states she did herbal remedies.  Does not have any problem taking medications moving forward  Do you ever take your medications differently than prescribed? N/a  Do you have issues affording your medications? Unsure- should be affordable with her  insurance  Do you have any concerns with mobility at home? Not at this time but states she is more tired than normal these days.  Do you use any assistive devices at home or have PCS at home? No- interested in getting PCS to help with tasks due to fatigue.  Do you have a PCP? Set up with appt at [facility]  Do you have any trouble reading or writing? no  Are there any additional barriers you see to getting the care you need? Reports some dental concerns- states that MD in hospital getting her referred for outpatient dental follow up.                                    HRT/VAS Care Coordination     Patients Home Cardiology Office Heart Failure Clinic   Outpatient Care Team Community Paramedicine; Social Worker   Social Worker Name: Tammy Sours, [facility], [phone]   Living arrangements for the past 2 months Apartment   Lives with: Self   Patient Current Insurance Coverage  Managed Medicare; Medicaid   Patient Has Concern With Paying Medical Bills No   Does Patient Have Prescription Coverage? Yes       Social History:                                                                             SDOH Screenings   Food Insecurity: No Food Insecurity (03/26/2022)  Housing: Low Risk  (03/26/2022)  Transportation Needs: Unmet Transportation Needs (03/25/2022)  Financial Resource Strain: Low Risk  (03/26/2022)  Tobacco Use: High Risk (03/23/2022)    SDOH Interventions: Financial Resources:  Financial Strain Interventions: Intervention Not Indicated Gets $800ish dollars/month from retirement- states she got it early  Haematologist Insecurity:  Food Insecurity Interventions: Intervention Not Indicated  Housing Insecurity:  Housing Interventions: Intervention Not Indicated  Transportation:   Has a car but needs repairs- consult placed to [facility] to help patient understand insurance benefits for transport   Follow-up plan:     Referral sent to paramedics for review and follow  up.  Jorge Ny, LCSW Clinical Social Worker Advanced Heart Failure Clinic Desk#: [phone] Cell#: [phone]

## 2022-03-26 NOTE — TOC CM/SW Note (Signed)
HF TOC CM spoke to pt at bedside. States her niece will provide transportation home. She has number to call and [facility] Medicare transportation to appt and Cardiac rehah. States her car needs repair. Pt has Life Vest in the home. Will arrange follow up appt with [facility] [place]. [place], Heart Failure TOC CM [phone]

## 2022-03-26 NOTE — Progress Notes (Addendum)
Patient ID: Rebecca Esparza, female   DOB: May 08, 1950, 72 y.o.   MRN: [id number]     Advanced Heart Failure Rounding Note  PCP-Cardiologist: None   Patient Profile 72 y/o female w/ h/o HTN and tobacco use, admitted for anterior STEMI c/b acute systolic heart failure/cardiogenic shock, suspect late presentation MI given several days of stuttering symptoms. Cath showed 90% left main, occluded LAD, 70% pLCx, 60% pRCA.  She had DES to left main and PTCA extensively in the LAD with diffuse 70% residual proximal LAD stenosis (medical management). EF 20-25%, no LV thrombus, normal RV. Stabilized w/ IABP and inotropes.   Subjective:    IABP discontinued 12/10     On GDMT. Rarely dizzy. Denies CP. SOB with prolonged ambulation. Feels very fatigued and worried about going home.   LifeVest has been delivered.   Objective:   Weight Range: 97.4 kg Body mass index is 38.05 kg/m.   Vital Signs:   Temp:  [97.6 F (36.4 C)-98.4 F (36.9 C)] 97.8 F (36.6 C) (02/15 0829) Pulse Rate:  [52-92] 52 (02/15 0829) Resp:  [18] 18 (02/15 0829) BP: (105-147)/(74-86) 147/84 (02/15 0829) SpO2:  [90 %-98 %] 90 % (02/15 0829) Last BM Date : 03/24/22  Weight change: Filed Weights   03/22/22 0600 03/23/22 0600 03/24/22 0500  Weight: 99.3 kg 97.7 kg 97.4 kg   Intake/Output:   Intake/Output Summary (Last 24 hours) at 03/26/2022 0931 Last data filed at 03/25/2022 1600 Gross per 24 hour  Intake 277.87 ml  Output --  Net 277.87 ml    Physical Exam  General:  elderly appearing.  No respiratory difficulty HEENT: normal Neck: supple. JVD ~7 cm. Carotids 2+ bilat; no bruits. No lymphadenopathy or thyromegaly appreciated. Cor: PMI nondisplaced. Regular rate & rhythm. No rubs, gallops or murmurs. Lungs: clear Abdomen: soft, nontender, nondistended. No hepatosplenomegaly. No bruits or masses. Good bowel sounds. Extremities: no cyanosis, clubbing, rash, +1 BLE edema  Neuro: alert & oriented x 3, cranial  nerves grossly intact. moves all 4 extremities w/o difficulty. Affect pleasant.   Telemetry  NSR 90s (Personally reviewed)    Labs    CBC Recent Labs    03/25/22 0225 03/26/22 0336  WBC 9.1 9.1  HGB 10.5* 10.6*  HCT 31.6* 31.0*  MCV 94.0 93.4  PLT 232 [id number]   Basic Metabolic Panel Recent Labs    03/25/22 0225 03/26/22 0336  NA 136 138  K 3.7 3.4*  CL 107 107  CO2 19* 19*  GLUCOSE 118* 129*  BUN 11 10  CREATININE 1.00 1.01*  CALCIUM 9.0 8.8*   Liver Function Tests No results for input(s): "AST", "ALT", "ALKPHOS", "BILITOT", "PROT", "ALBUMIN" in the last 72 hours.  No results for input(s): "LIPASE", "AMYLASE" in the last 72 hours. Cardiac Enzymes No results for input(s): "CKTOTAL", "CKMB", "CKMBINDEX", "TROPONINI" in the last 72 hours.  BNP: BNP (last 3 results) Recent Labs    03/20/22 1955  BNP 2,318.7*    ProBNP (last 3 results) No results for input(s): "PROBNP" in the last 8760 hours.   D-Dimer No results for input(s): "DDIMER" in the last 72 hours. Hemoglobin A1C No results for input(s): "HGBA1C" in the last 72 hours.  Fasting Lipid Panel No results for input(s): "CHOL", "HDL", "LDLCALC", "TRIG", "CHOLHDL", "LDLDIRECT" in the last 72 hours.  Thyroid Function Tests No results for input(s): "TSH", "T4TOTAL", "T3FREE", "THYROIDAB" in the last 72 hours.  Invalid input(s): "FREET3"  Other results:   Imaging   No results found.  Medications:  Scheduled Medications:  amoxicillin-clavulanate  1 tablet Oral Q12H   aspirin EC  81 mg Oral Daily   atorvastatin  80 mg Oral Daily   dapagliflozin propanediol  10 mg Oral Daily   digoxin  0.125 mg Oral Daily   enoxaparin (LOVENOX) injection  40 mg Subcutaneous Q24H   pantoprazole  40 mg Oral Daily   sacubitril-valsartan  1 tablet Oral BID   sodium chloride flush  3 mL Intravenous Q12H   spironolactone  12.5 mg Oral Daily   ticagrelor  90 mg Oral BID    Infusions:  sodium chloride Stopped  (03/24/22 0600)   sodium chloride     PRN Medications: sodium chloride, acetaminophen, nitroGLYCERIN, ondansetron (ZOFRAN) IV, mouth rinse, oxyCODONE, polyvinyl alcohol, sodium chloride flush  Assessment/Plan  1. CAD: Anterior STEMI, suspect late presentation given several days of stuttering symptoms. She was taken to the cath lab, cath showed 90% left main, occluded LAD, 70% pLCx, 60% pRCA.  She had DES to left main and PTCA extensively in the LAD with diffuse 70% residual proximal LAD stenosis.  She has significant diffuse residual LAD disease, discussed with Dr. Burt Knack and not planned for relook cath at this time.  No chest pain.   - Continue ASA 81/ticagrelor.  - Atorvastatin 80 mg daily.  2. Acute systolic CHF/cardiogenic shock:  Ischemic cardiomyopathy.  Echo showed EF 20-25%, no LV thrombus, normal RV, IVC normal, trivial MR. Shock now resolved. Stable off IABP and off inotropes. Euvolemic on exam  - continue Farxiga 10 mg daily  - Entresto 49-51 cut back to 24/26 mg bid yesterday  - continue digoxin 0.125 daily - Increase spironolactone 12.5>25 daily.  - no ? blocker yet w/ recent shock and soft BP  - Lifevest at discharge.  3. HTN: BP now is not elevated.  - GDMT per above  4. Smoking: She says she has quit.  5. AKI: resolved, 1.5>>1.0 today.   6. Tooth abscess: Abscess with significant pain.   - Continue Augmentin.  7. Hyperglycemia: Initially, but HgbA1c only 5.2.  8. DVT prophylaxis: Lovenox.  9. IDA: Ferritin 68. Tsat 9% - give IV Fe   Stable to d/c from AHF standpoint. Patient hesitant to go. After discussing with DO, plan for d/c tomorrow.  Will work on getting paramedicine to see her once discharged.   Length of Stay: [place], NP  03/26/2022, 9:31 AM  Advanced Heart Failure Team Pager [phone] (M-F; 7a - 5p)  Please contact [facility] for night-coverage after hours (5p -7a ) and weekends on [email or web address]

## 2022-03-26 NOTE — Telephone Encounter (Signed)
   Telephone encounter was:  Unsuccessful.  03/26/2022 Name: Rebecca Esparza MRN: [id number] DOB: 1950/06/15  Unsuccessful outbound call made today to assist with:  Transportation Needs   Outreach Attempt:  1st Attempt No voicemail available   [place] [phone] [address] , [place] [address] Email : [email or web address]

## 2022-03-26 NOTE — Progress Notes (Signed)
Mobility Specialist Progress Note:   03/26/22 1355  Mobility  Activity Ambulated independently in hallway;Ambulated with assistance in hallway  Level of Assistance Modified independent, requires aide device or extra time  Assistive Device None  Distance Ambulated (ft) 450 ft  Activity Response Tolerated well  Mobility Referral Yes  $Mobility charge 1 Mobility   Pt agreeable to mobility session. No physical assistance required. HR 143bpm at highest during ambulation. Pt states she "just doesn't feel right". Left with all needs met, in bed.   Nelta Numbers Mobility Specialist Please contact via SecureChat or  Rehab office at [phone]

## 2022-03-27 ENCOUNTER — Telehealth (HOSPITAL_COMMUNITY): Payer: Self-pay | Admitting: Licensed Clinical Social Worker

## 2022-03-27 ENCOUNTER — Telehealth: Payer: Self-pay | Admitting: *Deleted

## 2022-03-27 ENCOUNTER — Other Ambulatory Visit (HOSPITAL_COMMUNITY): Payer: Self-pay

## 2022-03-27 LAB — BASIC METABOLIC PANEL
Anion gap: 10 (ref 5–15)
BUN: 10 mg/dL (ref 8–23)
CO2: 20 mmol/L — ABNORMAL LOW (ref 22–32)
Calcium: 9.4 mg/dL (ref 8.9–10.3)
Chloride: 108 mmol/L (ref 98–111)
Creatinine, Ser: 1.02 mg/dL — ABNORMAL HIGH (ref 0.44–1.00)
GFR, Estimated: 59 mL/min — ABNORMAL LOW (ref >60)
Glucose, Bld: 116 mg/dL — ABNORMAL HIGH (ref 70–99)
Potassium: 3.9 mmol/L (ref 3.5–5.1)
Sodium: 138 mmol/L (ref 135–145)

## 2022-03-27 LAB — CBC
HCT: 32.2 % — ABNORMAL LOW (ref 36.0–46.0)
Hemoglobin: 10.7 g/dL — ABNORMAL LOW (ref 12.0–15.0)
MCH: 31.4 pg (ref 26.0–34.0)
MCHC: 33.2 g/dL (ref 30.0–36.0)
MCV: 94.4 fL (ref 80.0–100.0)
Platelets: 306 10*3/uL (ref 150–400)
RBC: 3.41 MIL/uL — ABNORMAL LOW (ref 3.87–5.11)
RDW: 13.4 % (ref 11.5–15.5)
WBC: 14 10*3/uL — ABNORMAL HIGH (ref 4.0–10.5)
nRBC: 0 % (ref 0.0–0.2)

## 2022-03-27 MED ORDER — NITROGLYCERIN 0.4 MG SL SUBL
0.4000 mg | SUBLINGUAL_TABLET | SUBLINGUAL | 12 refills | Status: AC | PRN
  Filled 2022-03-27: qty 25, 7d supply, fill #0

## 2022-03-27 MED ORDER — TICAGRELOR 90 MG PO TABS
90.0000 mg | ORAL_TABLET | Freq: Two times a day (BID) | ORAL | 11 refills | Status: DC
  Filled 2022-03-27: qty 60, 30d supply, fill #0
  Filled 2022-04-27: qty 180, 90d supply, fill #0

## 2022-03-27 MED ORDER — ASPIRIN 81 MG PO TBEC
81.0000 mg | DELAYED_RELEASE_TABLET | Freq: Every day | ORAL | 12 refills | Status: DC
  Filled 2022-03-27 – 2022-04-27 (×2): qty 30, 30d supply, fill #0

## 2022-03-27 MED ORDER — DIGOXIN 125 MCG PO TABS
0.1250 mg | ORAL_TABLET | Freq: Every day | ORAL | 5 refills | Status: DC
  Filled 2022-03-27: qty 30, 30d supply, fill #0

## 2022-03-27 MED ORDER — SPIRONOLACTONE 25 MG PO TABS
25.0000 mg | ORAL_TABLET | Freq: Every day | ORAL | 5 refills | Status: DC
  Filled 2022-03-27: qty 30, 30d supply, fill #0
  Filled 2022-04-27: qty 90, 90d supply, fill #0

## 2022-03-27 MED ORDER — DAPAGLIFLOZIN PROPANEDIOL 10 MG PO TABS
10.0000 mg | ORAL_TABLET | Freq: Every day | ORAL | 5 refills | Status: DC
  Filled 2022-03-27: qty 30, 30d supply, fill #0
  Filled 2022-04-27: qty 90, 90d supply, fill #0

## 2022-03-27 MED ORDER — PANTOPRAZOLE SODIUM 40 MG PO TBEC
40.0000 mg | DELAYED_RELEASE_TABLET | Freq: Every day | ORAL | 5 refills | Status: DC
  Filled 2022-03-27: qty 30, 30d supply, fill #0
  Filled 2022-04-27: qty 90, 90d supply, fill #0

## 2022-03-27 MED ORDER — SACUBITRIL-VALSARTAN 24-26 MG PO TABS
1.0000 | ORAL_TABLET | Freq: Two times a day (BID) | ORAL | 5 refills | Status: DC
  Filled 2022-03-27: qty 60, 30d supply, fill #0
  Filled 2022-04-27: qty 180, 90d supply, fill #0

## 2022-03-27 MED ORDER — AMOXICILLIN-POT CLAVULANATE 875-125 MG PO TABS
1.0000 | ORAL_TABLET | Freq: Two times a day (BID) | ORAL | 0 refills | Status: DC
  Filled 2022-03-27: qty 17, 9d supply, fill #0

## 2022-03-27 MED ORDER — ATORVASTATIN CALCIUM 80 MG PO TABS
80.0000 mg | ORAL_TABLET | Freq: Every day | ORAL | 5 refills | Status: DC
  Filled 2022-03-27: qty 30, 30d supply, fill #0
  Filled 2022-04-27: qty 90, 90d supply, fill #0

## 2022-03-27 NOTE — Progress Notes (Signed)
Pt declined amb, wants to walk at home. I answered questions about CRPII and promoted low na diet and daily weights. Pt understands.   Rebecca Esparza 03/27/2022 8:30 AM  [id number]

## 2022-03-27 NOTE — TOC Progression Note (Addendum)
Transition of Care [facility]) - Progression Note    Patient Details  Name: Rebecca Esparza MRN: [id number] Date of Birth: [birth year]  Transition of [facility] CM/SW Contact  Erenest Rasher, RN Phone Number: [phone] 03/27/2022, 2:05 PM  Clinical Narrative:    1430 Spoke to pt at bedside and states she does not recall how to use the [place] for a retrain with niece today at 6 pm. States she will put in the order.    Spoke to pt. Arranged follow up hospital appt on 2/29 at 225 pm to follow up at [facility]. Provided pt with a brochure. Provided pt with information on dentist in her area to call and arrange appt.   Expected Discharge Plan: Home/Self Care Barriers to Discharge: No Barriers Identified  Expected Discharge Plan and Services   Discharge Planning Services: CM Consult   Living arrangements for the past 2 months: Apartment Expected Discharge Date: 03/27/22                    Social Determinants of Health (SDOH) Interventions SDOH Screenings   Food Insecurity: No Food Insecurity (03/26/2022)  Housing: Low Risk  (03/26/2022)  Transportation Needs: Unmet Transportation Needs (03/26/2022)  Utilities: Not At Risk (03/26/2022)  Financial Resource Strain: Low Risk  (03/26/2022)  Tobacco Use: High Risk (03/23/2022)    Readmission Risk Interventions     No data to display

## 2022-03-27 NOTE — Progress Notes (Signed)
Mobility Specialist Progress Note:   03/27/22 1150  Mobility  Activity Ambulated independently in hallway  Level of Assistance Modified independent, requires aide device or extra time  Assistive Device None  Distance Ambulated (ft) 450 ft  Activity Response Tolerated well  Mobility Referral Yes  $Mobility charge 1 Mobility   Pre Mobility: HR 100bpm During Mobility: HR 120bpm Post Mobility: HR 97bpm  Pt agreeable to mobility session. Required no physical assistance throughout. Pt back in bed with all needs met, eating lunch.  Nelta Numbers Mobility Specialist Please contact via SecureChat or  Rehab office at [phone]

## 2022-03-27 NOTE — Progress Notes (Addendum)
Pt stated that she is NOT comfortable going home tonight.  She stated she would like to stay one more night in the hospital.  Dr. Sharolyn Douglas notified and made aware of pt not having any IV access. Pt is having her life vest on as well as has resumed cardiac monitoring.

## 2022-03-27 NOTE — Telephone Encounter (Signed)
   Telephone encounter was:  Unsuccessful.  03/27/2022 Name: Rebecca Esparza MRN: [id number] DOB: 1950/04/04  Unsuccessful outbound call made today to assist with:  Transportation Needs   Outreach Attempt: 2ND  Mailbox is full   [place] [phone] [address] , [place] [address] Email : [email or web address]

## 2022-03-27 NOTE — Telephone Encounter (Signed)
   Telephone encounter was:  Successful.  03/27/2022 Name: Rebecca Esparza MRN: [id number] DOB: 1950/09/10  Rebecca Esparza is a 72 y.o. year old female who is a primary care patient of Patient, No Pcp Per . The community resource team was consulted for assistance with Transportation Needs   Care guide performed the following interventions: Patient provided with information about care guide support team and interviewed to confirm resource needs. Talked with patient no way for her to take notes will call back monday and advise her again [phone]  Follow Up Plan:  Care guide will follow up with patient by phone over the next days  [place] [address] , [place] [address] Email : [email or web address]

## 2022-03-27 NOTE — Telephone Encounter (Signed)
H&V Care Navigation CSW Progress Note  Clinical Social Worker placed PCS referral to get aid in the home at pt request.   [place]: No Food Insecurity (03/26/2022)  Housing: Low Risk  (03/26/2022)  Transportation Needs: Unmet Transportation Needs (03/26/2022)  Utilities: Not At Risk (03/26/2022)  Financial Resource Strain: Low Risk  (03/26/2022)  Tobacco Use: High Risk (03/23/2022)    Jorge Ny, [facility] Desk#: [phone] Cell#: [phone]

## 2022-03-27 NOTE — Plan of Care (Signed)
  Problem: Education: Goal: Knowledge of General Education information will improve Description: Including pain rating scale, medication(s)/side effects and non-pharmacologic comfort measures Outcome: Progressing   Problem: Health Behavior/Discharge Planning: Goal: Ability to manage health-related needs will improve Outcome: Progressing   Problem: Clinical Measurements: Goal: Respiratory complications will improve Outcome: Progressing   Problem: Clinical Measurements: Goal: Cardiovascular complication will be avoided Outcome: Progressing   Problem: Safety: Goal: Ability to remain free from injury will improve Outcome: Progressing   Problem: Pain Managment: Goal: General experience of comfort will improve Outcome: Progressing

## 2022-03-28 LAB — CBC
HCT: 29.7 % — ABNORMAL LOW (ref 36.0–46.0)
Hemoglobin: 10 g/dL — ABNORMAL LOW (ref 12.0–15.0)
MCH: 31.4 pg (ref 26.0–34.0)
MCHC: 33.7 g/dL (ref 30.0–36.0)
MCV: 93.4 fL (ref 80.0–100.0)
Platelets: 302 10*3/uL (ref 150–400)
RBC: 3.18 MIL/uL — ABNORMAL LOW (ref 3.87–5.11)
RDW: 13.3 % (ref 11.5–15.5)
WBC: 12.6 10*3/uL — ABNORMAL HIGH (ref 4.0–10.5)
nRBC: 0 % (ref 0.0–0.2)

## 2022-03-28 NOTE — TOC Transition Note (Signed)
Transition of Care [facility]) - CM/SW Discharge Note   Patient Details  Name: Rebecca Esparza MRN: [id number] Date of Birth: [birth year]  Transition of Care [facility]) CM/SW Contact:  Tom-Johnson, Renea Ee, RN Phone Number: 03/28/2022, 12:34 PM   Clinical Narrative:     Patient is scheduled for discharge today. Outpatient f/u and instructions info on AVS. Niece to transport at discharge. No further TOC needs noted.          Final next level of care: Home/Self Care Barriers to Discharge: Barriers Resolved   Patient Goals and CMS Choice CMS Medicare.gov Compare Post Acute Care list provided to:: Patient    Discharge Placement                  Patient to be transferred to facility by: Niece      Discharge Plan and Services Additional resources added to the After Visit Summary for     Discharge Planning Services: CM Consult            DME Arranged: N/A DME Agency: NA       HH Arranged: NA HH Agency: NA        Social Determinants of Health (SDOH) Interventions SDOH Screenings   Food Insecurity: No Food Insecurity (03/26/2022)  Housing: Low Risk  (03/26/2022)  Transportation Needs: Unmet Transportation Needs (03/26/2022)  Utilities: Not At Risk (03/26/2022)  Financial Resource Strain: Low Risk  (03/26/2022)  Tobacco Use: High Risk (03/23/2022)     Readmission Risk Interventions     No data to display

## 2022-03-28 NOTE — Progress Notes (Signed)
Subjective/Interval - Patient still complaining of fatigue but no chest pain, shortness of breath, lower extremity edema, orthopnea or PND.  - Telemetry with minimal ectopy / PVCs.    Objective:  Vitals:   03/28/22 0420 03/28/22 0823  BP: 125/66 107/67  Pulse: 92 78  Resp: 18 18  Temp: (!) 97.3 F (36.3 C) 98.6 F (37 C)  SpO2: 97% 97%   Gen: laying comfortably in bed; NAD Heent: [place]/at; poor dentition  Lungs: CTA B/L; no crackles  CV: RRR; no murmurs; no peripheral edema, JVP <7cm Abd: soft, nontender Ext: warm and well perfused, no edema.    A/P:  - Rebecca Esparza is a 72 YO w/ hx of HTN and smoking that presented with a  large anterior STEMI now s/p PCI; IABP which has been removed. Transferred to the floor several days ago. She has been hemodynamically stable and asymptomatic from a cardiac standpoint. Telemetry has only shown infrequent PVCs. Patient provided life vest for discharge. She was allowed to stay an extra day due to anxiety about her HFrEF and generalized 'fatigue' / afocal complaints. Yesterday morning patient was discharged, however, refused to leave. This morning she asked if she could stay until Monday AM. She has no specific complaints at this time and does not meet criteria to remain inpatient. She is stable from a HFrEF standpoint and has been provided close clinic follow ups. She asked if her teeth could be 'managed' during this inpatient visit. I explained to her that we cannot provide those services inpatient. She was connected with social work and provided information on local dentist that could see her. Once more, discharging her today. Patient states her niece will pick her up at [facility].   At this point, there is no benefit of further inpatient monitoring; no medication changes or tests are pending during this admission.   Dorrene Bently Advanced Heart Failure 12:10 PM

## 2022-03-30 ENCOUNTER — Telehealth (HOSPITAL_COMMUNITY): Payer: Self-pay

## 2022-03-30 NOTE — Telephone Encounter (Signed)
Attempted to reach pt regarding home visit.  No answer when called today and LVM for her to return my call.  Will try again this week.    Marylouise Stacks, [place] 03/30/2022

## 2022-03-31 ENCOUNTER — Telehealth (HOSPITAL_COMMUNITY): Payer: Self-pay

## 2022-03-31 NOTE — Telephone Encounter (Signed)
Attempted to reach pt again to sch home visit.  No answer. LVM for her to return call.   Rebecca Esparza, [place] 03/31/2022

## 2022-04-01 ENCOUNTER — Telehealth (HOSPITAL_COMMUNITY): Payer: Self-pay

## 2022-04-01 ENCOUNTER — Telehealth: Payer: Self-pay | Admitting: *Deleted

## 2022-04-01 NOTE — Telephone Encounter (Signed)
I received phone call from [place] with [facility] about pt and how she needs text messages sent to her phone for all her upcoming appointments.   I advised her that I have yet to reach her to make initial visit but will get her a list of her appointments at that time.  Jeanine advised she prefers text as a contact method so will text the patient this afternoon to try to schedule that way.   Marylouise Stacks, [place] 04/01/2022

## 2022-04-01 NOTE — Telephone Encounter (Signed)
I was made aware that pt prefers to be texted, so I texted her in regards to home visit.   Will await her response.   Marylouise Stacks, [place] 04/01/2022

## 2022-04-01 NOTE — Telephone Encounter (Signed)
   Telephone encounter was:  Successful.  04/01/2022 Name: Rebecca Esparza MRN: [id number] DOB: May 21, 1950  Rebecca Esparza is a 72 y.o. year old female who is a primary care patient of Patient, No Pcp Per . The community resource team was consulted for assistance with Transportation Needs  and [place] guide performed the following interventions: Patient provided with information about care guide support team and interviewed to confirm resource needs.  Follow Up Plan:  Care guide will follow up with patient by phone over the next days  [place] [phone] [address] , [place] [address] Email : [email or web address]

## 2022-04-02 ENCOUNTER — Telehealth (HOSPITAL_COMMUNITY): Payer: Self-pay | Admitting: Licensed Clinical Social Worker

## 2022-04-02 NOTE — Telephone Encounter (Signed)
H&V Care Navigation CSW Progress Note  Clinical Social Worker asked to help contact patient who has not been responding to Paramedic efforts.  CSW called pt and was able to discuss.  She is preoccupied with upcoming appts and feels like she has a lot on her schedule.  CSW reviewed upcoming appts and sent picture of appt information to her.  CSW also sent Community Paramedic contact information and she will plan to call paramedic to set up initial home visit.   SDOH Screenings   Food Insecurity: No Food Insecurity (03/26/2022)  Housing: Low Risk  (03/26/2022)  Transportation Needs: Unmet Transportation Needs (03/26/2022)  Utilities: Not At Risk (03/26/2022)  Financial Resource Strain: Low Risk  (03/26/2022)  Tobacco Use: High Risk (03/23/2022)   Jorge Ny, [facility] Desk#: [phone] Cell#: [phone]

## 2022-04-06 ENCOUNTER — Telehealth (HOSPITAL_COMMUNITY): Payer: Self-pay

## 2022-04-06 NOTE — Telephone Encounter (Signed)
I texted her again today per request to reach her. No response.  Eliezer Lofts, Education officer, museum, also spoke to her last week and pt said she would reach out to me.  I have not heard from her.  Will try again this week.    Marylouise Stacks, [place] 04/06/2022

## 2022-04-09 ENCOUNTER — Telehealth (INDEPENDENT_AMBULATORY_CARE_PROVIDER_SITE_OTHER): Payer: Self-pay | Admitting: General Practice

## 2022-04-09 ENCOUNTER — Inpatient Hospital Stay (INDEPENDENT_AMBULATORY_CARE_PROVIDER_SITE_OTHER): Payer: 59 | Admitting: Primary Care

## 2022-04-09 NOTE — Telephone Encounter (Signed)
Patient would like those medications sent to the [facility] on [address] in [place].

## 2022-04-09 NOTE — Telephone Encounter (Signed)
Patient had heart failure in the hospital and her appointment is not until 04/27/22. Patient needs medications on a short supply until her appointment. amoxicillin-clavulanate (AUGMENTIN) 875-125 MG tablet [id number]  aspirin EC 81 MG tablet [id number]   atorvastatin (LIPITOR) 80 MG tablet [id number]  dapagliflozin propanediol (FARXIGA) 10 MG TABS tablet [id number]  digoxin (LANOXIN) 0.125 MG tablet [id number]  nitroGLYCERIN (NITROSTAT) 0.4 MG SL tablet [id number]  pantoprazole (PROTONIX) 40 MG tablet [id number]  sacubitril-valsartan (ENTRESTO) 24-26 MG [id number]  spironolactone (ALDACTONE) 25 MG tablet [id number]  ticagrelor (BRILINTA) 90 MG TABS tablet [id number]

## 2022-04-09 NOTE — Telephone Encounter (Signed)
Patient called to clarify the need for Augmentin refill. She says she doesn't need it because she was told when she was discharged to take it all and she is done. She says she only needs the 9 refilled.

## 2022-04-14 ENCOUNTER — Ambulatory Visit (HOSPITAL_COMMUNITY)
Admit: 2022-04-14 | Discharge: 2022-04-14 | Disposition: A | Discharge status: Home / Self Care | Payer: 59 | Attending: Family Medicine | Admitting: Family Medicine

## 2022-04-14 ENCOUNTER — Other Ambulatory Visit (HOSPITAL_COMMUNITY): Payer: Self-pay

## 2022-04-14 ENCOUNTER — Encounter (HOSPITAL_COMMUNITY): Payer: Self-pay

## 2022-04-14 VITALS — BP 132/92 | HR 113 | Wt 212.0 lb

## 2022-04-14 DIAGNOSIS — D509 Iron deficiency anemia, unspecified: Secondary | ICD-10-CM | POA: Diagnosis not present

## 2022-04-14 DIAGNOSIS — Z87891 Personal history of nicotine dependence: Secondary | ICD-10-CM | POA: Diagnosis not present

## 2022-04-14 DIAGNOSIS — B379 Candidiasis, unspecified: Secondary | ICD-10-CM | POA: Diagnosis not present

## 2022-04-14 DIAGNOSIS — I255 Ischemic cardiomyopathy: Secondary | ICD-10-CM | POA: Diagnosis not present

## 2022-04-14 DIAGNOSIS — Z955 Presence of coronary angioplasty implant and graft: Secondary | ICD-10-CM | POA: Diagnosis not present

## 2022-04-14 DIAGNOSIS — Z7902 Long term (current) use of antithrombotics/antiplatelets: Secondary | ICD-10-CM | POA: Diagnosis not present

## 2022-04-14 DIAGNOSIS — I5022 Chronic systolic (congestive) heart failure: Secondary | ICD-10-CM | POA: Diagnosis not present

## 2022-04-14 DIAGNOSIS — F17200 Nicotine dependence, unspecified, uncomplicated: Secondary | ICD-10-CM | POA: Diagnosis not present

## 2022-04-14 DIAGNOSIS — R9431 Abnormal electrocardiogram [ECG] [EKG]: Secondary | ICD-10-CM | POA: Insufficient documentation

## 2022-04-14 DIAGNOSIS — Z79899 Other long term (current) drug therapy: Secondary | ICD-10-CM | POA: Insufficient documentation

## 2022-04-14 DIAGNOSIS — I2510 Atherosclerotic heart disease of native coronary artery without angina pectoris: Secondary | ICD-10-CM | POA: Diagnosis not present

## 2022-04-14 DIAGNOSIS — I110 Hypertensive heart disease with heart failure: Secondary | ICD-10-CM | POA: Insufficient documentation

## 2022-04-14 DIAGNOSIS — Z139 Encounter for screening, unspecified: Secondary | ICD-10-CM | POA: Diagnosis not present

## 2022-04-14 DIAGNOSIS — I10 Essential (primary) hypertension: Secondary | ICD-10-CM | POA: Diagnosis not present

## 2022-04-14 LAB — BASIC METABOLIC PANEL
Anion gap: 12 (ref 5–15)
BUN: 10 mg/dL (ref 8–23)
CO2: 18 mmol/L — ABNORMAL LOW (ref 22–32)
Calcium: 9.8 mg/dL (ref 8.9–10.3)
Chloride: 109 mmol/L (ref 98–111)
Creatinine, Ser: 1.39 mg/dL — ABNORMAL HIGH (ref 0.44–1.00)
GFR, Estimated: 41 mL/min — ABNORMAL LOW (ref >60)
Glucose, Bld: 104 mg/dL — ABNORMAL HIGH (ref 70–99)
Potassium: 3.5 mmol/L (ref 3.5–5.1)
Sodium: 139 mmol/L (ref 135–145)

## 2022-04-14 LAB — BRAIN NATRIURETIC PEPTIDE: B Natriuretic Peptide: 1239.4 pg/mL — ABNORMAL HIGH (ref 0.0–100.0)

## 2022-04-14 LAB — DIGOXIN LEVEL: Digoxin Level: 0.9 ng/mL (ref 0.8–2.0)

## 2022-04-14 MED ORDER — FLUCONAZOLE 150 MG PO TABS: 150.0000 mg | ORAL_TABLET | Freq: Once | ORAL | 0 refills | Status: AC

## 2022-04-14 MED ORDER — CARVEDILOL 6.25 MG PO TABS: 6.2500 mg | ORAL_TABLET | Freq: Two times a day (BID) | ORAL | 3 refills | Status: DC

## 2022-04-14 NOTE — Progress Notes (Signed)
Pt states that life vest is not working and will not turn on. Pt states that she changes her battery out every night at midnight and charges the other battery. Spoke with life vest support and they stated that they would send someone out to her house, they will call her and set up a time to get her life vest working today. Also spoke with Chrys Racer a life vest rep. She stated that they will get someone to replace life vest if need be. Pt understood that she will be receiving a call.

## 2022-04-14 NOTE — Progress Notes (Signed)
Paramedicine Encounter   Patient ID: Rebecca Esparza , female,   DOB: 04-11-1950,71 y.o.,  MRN: [id number]   Met patient in clinic today with provider.  Weight @ clinic-212 B/P-132/92 P-113 [id number]  EKG done.   Pt is agreeable for home visits if we meet in the lobby. Will call next week.   Poss yeast infection-sending in rx  Wears life vest-battery dead today-unable to turn on.   She was recently d/c from having MI.  She uses [facility]. She says they deliver.  She still using meds form [facility].  She does drive but her car is not driveable at the moment.  She uses her insurance benefit for transportation.  She was referred to cardiac rehab and is willing to do that.  She now is waiting to hear back from them to sch.  She has purwick urinary system she uses.  She lives alone at [address].   Her PCP is dr Oletta Lamas at [facility]. She has appoint on 3/18.   Med changes-carvedilol added today   Marylouise Stacks, [place] 04/14/2022

## 2022-04-14 NOTE — Patient Instructions (Addendum)
Thank you for coming in today  Labs were done today, if any labs are abnormal the clinic will call you No news is good news  Medications: Start Carvedilol 6.25 mg 1 tablet twice daily Fluconazole 150 mg 1 tablet Use can use over the counter yeast cream  Follow up appointments:  Your physician recommends that you schedule a follow-up appointment in:  4 weeks in clinic  3 months with Dr. Aundra Dubin with echocardiogram You will receive a reminder letter in the mail a few months in advance. If you don't receive a letter, please call our office to schedule the follow-up appointment.  Your physician has requested that you have an echocardiogram. Echocardiography is a painless test that uses sound waves to create images of your heart. It provides your doctor with information about the size and shape of your heart and how well your heart's chambers and valves are working. This procedure takes approximately one hour. There are no restrictions for this procedure.     Do the following things EVERYDAY: Weigh yourself in the morning before breakfast. Write it down and keep it in a log. Take your medicines as prescribed Eat low salt foods--Limit salt (sodium) to 2000 mg per day.  Stay as active as you can everyday Limit all fluids for the day to less than 2 liters   At the [facility], you and your health needs are our priority. As part of our continuing mission to provide you with exceptional heart care, we have created designated Provider Care Teams. These Care Teams include your primary Cardiologist (physician) and Advanced Practice Providers (APPs- Physician Assistants and Nurse Practitioners) who all work together to provide you with the care you need, when you need it.   You may see any of the following providers on your designated Care Team at your next follow up: Dr Glori Bickers Dr Loralie Champagne Dr. Roxana Hires, NP Lyda Jester, [place] [facility] [place], [place] Forestine Na, NP Audry Riles, PharmD   Please be sure to bring in all your medications bottles to every appointment.    Thank you for choosing [facility]  If you have any questions or concerns before your next appointment please send [place] a message through [place] or call our office at [phone].    TO LEAVE A MESSAGE FOR THE NURSE SELECT OPTION 2, PLEASE LEAVE A MESSAGE INCLUDING: YOUR NAME DATE OF BIRTH CALL BACK NUMBER REASON FOR CALL**this is important as we prioritize the call backs  YOU WILL RECEIVE A CALL BACK THE SAME DAY AS LONG AS YOU CALL BEFORE 4:00 PM

## 2022-04-14 NOTE — Progress Notes (Addendum)
ADVANCED HF CLINIC CONSULT NOTE  Primary Care: Patient, No Pcp Per HF Cardiologist: Dr. Aundra Dubin  HPI: 72 y.o. female with history of HTN and smoking, with a new history of CAD and systolic heart failure/iCM.  She was admitted 2/24 with CP. EKG with anteroseptal and lateral ST elevation. She was taken to the cath lab, showing 90% left main, occluded LAD, 70% pLCx, 60% pRCA. She had DES to left main and PTCA extensively in the LAD with diffuse 70% residual proximal LAD stenosis. RHC showed significantly elevated right and left heart filling pressures, IABP was placed and patient received IV Lasix. Echo showed EF 20-25%, normal RV. AHF consulted. She continued diuresis with IV lasix, IABP eventually removed. GDMT titrated, she was fitted for LifeVest and discharged home, weight 213 lbs.  Today she returns for HF follow up. Overall feeling fine. Main complaint is vaginal itching, she says it started during admission after using Purewick and harsh soap. No dysuria, low back pain, fevers or abnormal discharge. She is not short of breath with ADLs, has dyspnea walking further distances on flat ground. Denies palpitations, CP, dizziness, edema, or PND/Orthopnea. Appetite ok. No fever or chills. Weight at home 214 pounds. Taking all medications.   ECG (personally reviewed): ST 111 bpm  LifeVest interrogation: only worn 5 minutes, she says she has been wearing it since discharge.  Labs (2/24): K 3.9, creatinine 1.02, hgb 10  Cardiac Studies  - R/LHC (2/24): 90% left main, occluded LAD, 70% pLCx, 60% pRCA.  She had DES to left main and PTCA extensively in the LAD with diffuse 70% residual proximal LAD stenosis.  RA 20, PA 49/32 (40), PCWP 33, CI from swan 2.35  - Echo (2/24):EF 20-25%, no LV thrombus, normal RV, IVC normal, trivial MR  Review of Systems: [y] = yes, '[ ]'$  = no   General: Weight gain '[ ]'$ ; Weight loss '[ ]'$ ; Anorexia '[ ]'$ ; Fatigue Blue.Reese ]; Fever '[ ]'$ ; Chills '[ ]'$ ; Weakness '[ ]'$   Cardiac: Chest  pain/pressure '[ ]'$ ; Resting SOB '[ ]'$ ; Exertional SOB Blue.Reese ]; Orthopnea '[ ]'$ ; Pedal Edema '[ ]'$ ; Palpitations '[ ]'$ ; Syncope '[ ]'$ ; Presyncope '[ ]'$ ; Paroxysmal nocturnal dyspnea'[ ]'$   Pulmonary: Cough '[ ]'$ ; Wheezing'[ ]'$ ; Hemoptysis'[ ]'$ ; Sputum '[ ]'$ ; Snoring '[ ]'$   GI: Vomiting'[ ]'$ ; Dysphagia'[ ]'$ ; Melena'[ ]'$ ; Hematochezia '[ ]'$ ; Heartburn'[ ]'$ ; Abdominal pain '[ ]'$ ; Constipation '[ ]'$ ; Diarrhea '[ ]'$ ; BRBPR '[ ]'$   GU: Hematuria'[ ]'$ ; Dysuria '[ ]'$ ; Nocturia'[ ]'$   Vascular: Pain in legs with walking '[ ]'$ ; Pain in feet with lying flat '[ ]'$ ; Non-healing sores '[ ]'$ ; Stroke '[ ]'$ ; TIA '[ ]'$ ; Slurred speech '[ ]'$ ;  Neuro: Headaches'[ ]'$ ; Vertigo'[ ]'$ ; Seizures'[ ]'$ ; Paresthesias'[ ]'$ ;Blurred vision '[ ]'$ ; Diplopia '[ ]'$ ; Vision changes '[ ]'$   Ortho/Skin: Arthritis '[ ]'$ ; Joint pain '[ ]'$ ; Muscle pain '[ ]'$ ; Joint swelling '[ ]'$ ; Back Pain '[ ]'$ ; Rash '[ ]'$   Psych: Depression'[ ]'$ ; Anxiety'[ ]'$   Heme: Bleeding problems '[ ]'$ ; Clotting disorders '[ ]'$ ; Anemia Blue.Reese ]  Endocrine: Diabetes '[ ]'$ ; Thyroid dysfunction'[ ]'$   No past medical history on file.  Current Outpatient Medications  Medication Sig Dispense Refill   aspirin EC 81 MG tablet Take 1 tablet (81 mg total) by mouth daily. Swallow whole. 30 tablet 12   atorvastatin (LIPITOR) 80 MG tablet Take 1 tablet (80 mg total) by mouth daily. 30 tablet 5   dapagliflozin propanediol (FARXIGA) 10 MG TABS tablet Take 1 tablet (10 mg total)  by mouth daily. 30 tablet 5   digoxin (LANOXIN) 0.125 MG tablet Take 1 tablet (0.125 mg total) by mouth daily. 30 tablet 5   nitroGLYCERIN (NITROSTAT) 0.4 MG SL tablet Place 1 tablet (0.4 mg total) under the tongue every 5 (five) minutes x 3 doses as needed for chest pain. 25 tablet 12   pantoprazole (PROTONIX) 40 MG tablet Take 1 tablet (40 mg total) by mouth daily. 30 tablet 5   sacubitril-valsartan (ENTRESTO) 24-26 MG Take 1 tablet by mouth 2 (two) times daily. 60 tablet 5   spironolactone (ALDACTONE) 25 MG tablet Take 1 tablet (25 mg total) by mouth daily. 30 tablet 5   ticagrelor (BRILINTA) 90 MG TABS  tablet Take 1 tablet (90 mg total) by mouth 2 (two) times daily. 60 tablet 11   No current facility-administered medications for this encounter.   No Known Allergies  Social History   Socioeconomic History   Marital status: Single    Spouse name: Not on file   Number of children: Not on file   Years of education: Not on file   Highest education level: Not on file  Occupational History   Not on file  Tobacco Use   Smoking status: Every Day   Smokeless tobacco: Never   Tobacco comments:    5-7 cigs/day  Vaping Use   Vaping Use: Never used  Substance and Sexual Activity   Alcohol use: Not Currently   Drug use: Not on file   Sexual activity: Not on file  Other Topics Concern   Not on file  Social History Narrative   Not on file   Social Determinants of Health   Financial Resource Strain: Low Risk  (03/26/2022)   Overall Financial Resource Strain (CARDIA)    Difficulty of Paying Living Expenses: Not very hard  Food Insecurity: No Food Insecurity (03/26/2022)   Hunger Vital Sign    Worried About Running Out of Food in the Last Year: Never true    Ran Out of Food in the Last Year: Never true  Transportation Needs: Unmet Transportation Needs (03/26/2022)   PRAPARE - Hydrologist (Medical): Yes    Lack of Transportation (Non-Medical): Yes  Physical Activity: Not on file  Stress: Not on file  Social Connections: Not on file  Intimate Partner Violence: Not At Risk (03/26/2022)   Humiliation, Afraid, Rape, and Kick questionnaire    Fear of Current or Ex-Partner: No    Emotionally Abused: No    Physically Abused: No    Sexually Abused: No   No family history on file.  BP (!) 132/92   Pulse (!) 113   Wt 96.2 kg (212 lb)   SpO2 98%   BMI 37.56 kg/m   Wt Readings from Last 3 Encounters:  04/14/22 96.2 kg (212 lb)  03/24/22 97.4 kg (214 lb 11.7 oz)   PHYSICAL EXAM: General:  NAD. No resp difficulty, arrived in [facility] HEENT: Normal Neck:  Supple. No JVD. Carotids 2+ bilat; no bruits. No lymphadenopathy or thryomegaly appreciated. Cor: PMI nondisplaced. Regular rate & rhythm. No rubs, gallops or murmurs. Lungs: Clear Abdomen: Soft, nontender, nondistended. No hepatosplenomegaly. No bruits or masses. Good bowel sounds. Extremities: No cyanosis, clubbing, rash, edema Neuro: Alert & oriented x 3, cranial nerves grossly intact. Moves all 4 extremities w/o difficulty. Affect pleasant.  ASSESSMENT & PLAN: 1. CAD: Anterior STEMI (2/24), suspect late presentation given several days of stuttering symptoms. Cath showed 90% left main, occluded LAD, 70%  pLCx, 60% pRCA.  She had DES to left main and PTCA extensively in the LAD with diffuse 70% residual proximal LAD stenosis.  She has significant diffuse residual LAD disease, case discussed with Dr. Burt Knack and not planned for relook cath at this time.  No chest pain.   - Continue ASA 81/ticagrelor.  - Continue atorvastatin 80 mg daily.  - Referred to CR. Encouraged her to participate. 2. Chronic systolic heart failure:  Ischemic cardiomyopathy.  Echo showed EF 20-25%, no LV thrombus, normal RV, IVC normal, trivial MR. Required IABP and inotropes during admission. NYHA II-early III, she is not volume overloaded today. - Start Coreg 6.25 mg bid - Continue Farxiga 10 mg daily. BMET and BNP today. Discussed GU side effects. - Continue Entrest 24/26 mg bid - Continue digoxin 0.125 daily. Check dig level today. - Continue spiro 25 mg daily. - Continue LifeVest, discussed importance of wearing. - Repeat echo in 3 months. 3. HTN: BP elevated today. - GDMT per above  4. Smoking: She says she has quit 4 days before MI. 5. ? Yeast infection: Sounds like dermatitis, but given SGLT2i use, will give fluconazole 150 mg po x 1, needs PCP follow up for further evaluation if does not resolve. Discussed trying OTC topical as well. - QTc 459 on ECG today.  6. IDA: Ferritin 68. Tsat 9% - give IV Fe during  admission. 7. SDOH: Poor insight into health conditions. She is now followed by paramedicine. Katie met with her today.   Follow up in 4 weeks with APP and 12 weeks with Dr. Aundra Dubin + echo.  [place], FNP-BC. 04/14/22

## 2022-04-20 ENCOUNTER — Telehealth (HOSPITAL_COMMUNITY): Payer: Self-pay

## 2022-04-20 NOTE — Telephone Encounter (Signed)
Called pt like we agreed to do from last week clinic appointment- She did not answer phone, I did LVM for her to return my call, I also sent her text asking her to call me back so we can schedule home visit.   Rebecca Esparza, [place] 04/20/2022

## 2022-04-21 ENCOUNTER — Telehealth (HOSPITAL_COMMUNITY): Payer: Self-pay

## 2022-04-21 NOTE — Telephone Encounter (Signed)
Attempted to reach pt again, no answer, I did LVM again for her to call me back.   Rebecca Esparza, [place] 04/21/2022

## 2022-04-22 ENCOUNTER — Telehealth (HOSPITAL_COMMUNITY): Payer: Self-pay

## 2022-04-22 ENCOUNTER — Other Ambulatory Visit (HOSPITAL_COMMUNITY): Payer: Self-pay

## 2022-04-22 ENCOUNTER — Other Ambulatory Visit (HOSPITAL_COMMUNITY): Payer: Self-pay | Admitting: Family Medicine

## 2022-04-22 MED ORDER — DIGOXIN 125 MCG PO TABS
0.0625 mg | ORAL_TABLET | Freq: Every day | ORAL | 5 refills | Status: DC
  Filled 2022-04-22: qty 30, 60d supply, fill #0

## 2022-04-22 MED ORDER — DIGOXIN 125 MCG PO TABS: 0.0625 mg | ORAL_TABLET | Freq: Every day | ORAL | 5 refills | Status: DC

## 2022-04-22 NOTE — Progress Notes (Signed)
Paramedicine Encounter    Patient ID: Rebecca Esparza, female    DOB: Oct 09, 1950, 72 y.o.   MRN: [id number]    Pt has not been answering phone or returning texts so I did just show up at her door. When I seen her in clinic last week she said to call her Monday, but would probably plan for Wednesday for a visit. She didn't answer Monday so I did decide to pop by.  When I arrived she barely opened door enough to look at me, she reports she is not feeling well today and for me to call her this evening around 530.  I asked her if I could check her out and get her v/s and she refused. She said she just isnt feeling well b/c of her heart is weak. I asked if she was having c/o or palpitations and she denied that. She spoke in full sentences.  I asked if she wanted to meet me in the lobby that was right next to her apt and she refused. She asked why she needed a paramedic to come out to see her and I explained to help assess her symptoms, check her v/s and help with meds in between doctor appointments.  I then mentioned that I was notified of a med change of digoxin- she seemed confused and so I said again I can help clarify her medicines and dosages etc but she declined.   She again asked me to call her later this evening and she was closing the door more.  Will try again later.   Patient Care Team: Patient, No Pcp Per as PCP - General (General Practice)  Patient Active Problem List   Diagnosis Date Noted   STEMI involving left anterior descending coronary artery ([place]) 03/20/2022    Current Outpatient Medications:    aspirin EC 81 MG tablet, Take 1 tablet (81 mg total) by mouth daily. Swallow whole., Disp: 30 tablet, Rfl: 12   atorvastatin (LIPITOR) 80 MG tablet, Take 1 tablet (80 mg total) by mouth daily., Disp: 30 tablet, Rfl: 5   carvedilol (COREG) 6.25 MG tablet, Take 1 tablet (6.25 mg total) by mouth 2 (two) times daily., Disp: 180 tablet, Rfl: 3   dapagliflozin propanediol (FARXIGA) 10 MG TABS  tablet, Take 1 tablet (10 mg total) by mouth daily., Disp: 30 tablet, Rfl: 5   digoxin (LANOXIN) 0.125 MG tablet, Take 1 tablet (0.125 mg total) by mouth daily., Disp: 30 tablet, Rfl: 5   nitroGLYCERIN (NITROSTAT) 0.4 MG SL tablet, Place 1 tablet (0.4 mg total) under the tongue every 5 (five) minutes x 3 doses as needed for chest pain., Disp: 25 tablet, Rfl: 12   pantoprazole (PROTONIX) 40 MG tablet, Take 1 tablet (40 mg total) by mouth daily., Disp: 30 tablet, Rfl: 5   sacubitril-valsartan (ENTRESTO) 24-26 MG, Take 1 tablet by mouth 2 (two) times daily., Disp: 60 tablet, Rfl: 5   spironolactone (ALDACTONE) 25 MG tablet, Take 1 tablet (25 mg total) by mouth daily., Disp: 30 tablet, Rfl: 5   ticagrelor (BRILINTA) 90 MG TABS tablet, Take 1 tablet (90 mg total) by mouth 2 (two) times daily., Disp: 60 tablet, Rfl: 11 No Known Allergies    Social History   Socioeconomic History   Marital status: Single    Spouse name: Not on file   Number of children: Not on file   Years of education: Not on file   Highest education level: Not on file  Occupational History   Not on  file  Tobacco Use   Smoking status: Every Day   Smokeless tobacco: Never   Tobacco comments:    5-7 cigs/day  Vaping Use   Vaping Use: Never used  Substance and Sexual Activity   Alcohol use: Not Currently   Drug use: Not on file   Sexual activity: Not on file  Other Topics Concern   Not on file  Social History Narrative   Not on file   Social Determinants of Health   Financial Resource Strain: Low Risk  (03/26/2022)   Overall Financial Resource Strain (CARDIA)    Difficulty of Paying Living Expenses: Not very hard  Food Insecurity: No Food Insecurity (03/26/2022)   Hunger Vital Sign    Worried About Running Out of Food in the Last Year: Never true    Ran Out of Food in the Last Year: Never true  Transportation Needs: Unmet Transportation Needs (03/26/2022)   PRAPARE - Hydrologist  (Medical): Yes    Lack of Transportation (Non-Medical): Yes  Physical Activity: Not on file  Stress: Not on file  Social Connections: Not on file  Intimate Partner Violence: Not At Risk (03/26/2022)   Humiliation, Afraid, Rape, and Kick questionnaire    Fear of Current or Ex-Partner: No    Emotionally Abused: No    Physically Abused: No    Sexually Abused: No    Physical Exam      Future Appointments  Date Time Provider [place]  04/27/2022  2:10 PM Kerin Perna, NP [facility] None  05/12/2022  2:00 PM [place] PA/NP [place] None       Marylouise Stacks, Paramedic [place] [facility]  04/22/22

## 2022-04-22 NOTE — Telephone Encounter (Signed)
TOC cannot fill her prescription since she has left the office. I am unsure which pharmacy to send to because she has [facility] and [facility] listed. I tried to reach her, but no answer. Can you find out and I will get sent in for her.

## 2022-04-26 DIAGNOSIS — I213 ST elevation (STEMI) myocardial infarction of unspecified site: Secondary | ICD-10-CM | POA: Diagnosis not present

## 2022-04-26 DIAGNOSIS — I420 Dilated cardiomyopathy: Secondary | ICD-10-CM | POA: Diagnosis not present

## 2022-04-27 ENCOUNTER — Other Ambulatory Visit: Payer: Self-pay

## 2022-04-27 ENCOUNTER — Encounter (INDEPENDENT_AMBULATORY_CARE_PROVIDER_SITE_OTHER): Payer: Self-pay | Admitting: Primary Care

## 2022-04-27 ENCOUNTER — Ambulatory Visit (INDEPENDENT_AMBULATORY_CARE_PROVIDER_SITE_OTHER): Payer: 59 | Admitting: Primary Care

## 2022-04-27 VITALS — BP 107/75 | HR 85 | Resp 16 | Ht 63.0 in | Wt 211.2 lb

## 2022-04-27 DIAGNOSIS — R5382 Chronic fatigue, unspecified: Secondary | ICD-10-CM | POA: Diagnosis not present

## 2022-04-27 DIAGNOSIS — Z1159 Encounter for screening for other viral diseases: Secondary | ICD-10-CM | POA: Diagnosis not present

## 2022-04-27 DIAGNOSIS — E2839 Other primary ovarian failure: Secondary | ICD-10-CM

## 2022-04-27 DIAGNOSIS — D508 Other iron deficiency anemias: Secondary | ICD-10-CM

## 2022-04-27 DIAGNOSIS — Z7689 Persons encountering health services in other specified circumstances: Secondary | ICD-10-CM

## 2022-04-27 DIAGNOSIS — Z1231 Encounter for screening mammogram for malignant neoplasm of breast: Secondary | ICD-10-CM

## 2022-04-27 DIAGNOSIS — Z1211 Encounter for screening for malignant neoplasm of colon: Secondary | ICD-10-CM

## 2022-04-27 NOTE — Patient Instructions (Addendum)
Good sources of iron include beans, dried fruits, eggs, lean red meat, salmon, iron-fortified breads and cereals, peas, tofu, and dark green leafy vegetables. Vitamin C-rich foods such as oranges, strawberries, and tomatoes help your body absorb iron. PER [facility] Do the following things EVERYDAY: Weigh yourself in the morning before breakfast. Write it down and keep it in a log. Take your medicines as prescribed Eat low salt foods--Limit salt (sodium) to 2000 mg per day.  Stay as active as you can everyday Limit all fluids for the day to less than 2 liters

## 2022-04-27 NOTE — Progress Notes (Signed)
[facility]   Subjective:   Rebecca Esparza is a 72 y.o. female presents for hospital follow up and establish care.  Presented to the ED with several days of chest tightness and associated shortness of breath with a persistent cough. Admit date to the hospital was 03/20/22, patient was discharged from the hospital on 03/28/22, patient was admitted for: newly diagnosed multivessel CAD. Hospital f/u completed by Dr. Aundra Dubin.   No past medical history on file.   No Known Allergies    Current Outpatient Medications on File Prior to Visit  Medication Sig Dispense Refill   aspirin EC 81 MG tablet Take 1 tablet (81 mg total) by mouth daily. Swallow whole. 30 tablet 12   atorvastatin (LIPITOR) 80 MG tablet Take 1 tablet (80 mg total) by mouth daily. 30 tablet 5   carvedilol (COREG) 6.25 MG tablet Take 1 tablet (6.25 mg total) by mouth 2 (two) times daily. 180 tablet 3   dapagliflozin propanediol (FARXIGA) 10 MG TABS tablet Take 1 tablet (10 mg total) by mouth daily. 30 tablet 5   digoxin (LANOXIN) 0.125 MG tablet Take 0.5 tablets (0.0625 mg total) by mouth daily. 30 tablet 5   nitroGLYCERIN (NITROSTAT) 0.4 MG SL tablet Place 1 tablet (0.4 mg total) under the tongue every 5 (five) minutes x 3 doses as needed for chest pain. 25 tablet 12   pantoprazole (PROTONIX) 40 MG tablet Take 1 tablet (40 mg total) by mouth daily. 30 tablet 5   sacubitril-valsartan (ENTRESTO) 24-26 MG Take 1 tablet by mouth 2 (two) times daily. 60 tablet 5   spironolactone (ALDACTONE) 25 MG tablet Take 1 tablet (25 mg total) by mouth daily. 30 tablet 5   ticagrelor (BRILINTA) 90 MG TABS tablet Take 1 tablet (90 mg total) by mouth 2 (two) times daily. 60 tablet 11   No current facility-administered medications on file prior to visit.     Review of System: Comprehensive ROS Pertinent positive and negative noted in HPI    Objective:  Blood Pressure 107/75   Pulse 85   Respiration 16   Height 5\' 3"  (1.6 m)    Weight 211 lb 3.2 oz (95.8 kg)   Oxygen Saturation 100%   Body Mass Index 37.41 kg/m   Filed Weights   04/27/22 1359  Weight: 211 lb 3.2 oz (95.8 kg)   Physical Exam: General Appearance: Well nourished, obese in no apparent distress. Eyes: PERRLA, EOMs, conjunctiva no swelling or erythema Sinuses: No Frontal/maxillary tenderness ENT/Mouth: Ext aud canals clear, TMs without erythema, bulging. No erythema, swelling, or exudate on post pharynx.  Tonsils not swollen or erythematous. Hearing normal.  Neck: Supple, thyroid normal.  Respiratory: Respiratory effort normal, BS equal bilaterally without rales, rhonchi, wheezing or stridor.  Cardio: RRR with no MRGs. Brisk peripheral pulses without edema.  Abdomen: Soft, + BS.  Non tender, no guarding, rebound, hernias, masses. Lymphatics: Non tender without lymphadenopathy.  Musculoskeletal: Full ROM, 5/5 strength, normal gait.  Skin: Warm, dry without rashes, lesions, ecchymosis.  Neuro: Cranial nerves intact. Normal muscle tone, no cerebellar symptoms. Sensation intact.  Psych: Awake and oriented X 3, normal affect, Insight and Judgment appropriate.    Assessment:  Rebecca Esparza was seen today for hospitalization follow-up.  Diagnoses and all orders for this visit:  Encounter to establish care  Colon cancer screening -     Ambulatory referral to Gastroenterology  Estrogen deficiency -     DG Bone Density; Future  Encounter for screening mammogram for malignant neoplasm  of breast -     MM DIGITAL SCREENING BILATERAL; Future  Chronic fatigue 2/2 Other iron deficiency anemia  -     Vitamin B12 -     Vitamin D, 25-hydroxy  Encounter for HCV screening test for low risk patient -     HCV Ab w Reflex to Quant PCR   This note has been created with Surveyor, quantity. Any transcriptional errors are unintentional.   Kerin Perna, NP 04/27/2022, 2:45 PM

## 2022-04-28 ENCOUNTER — Telehealth (HOSPITAL_COMMUNITY): Payer: Self-pay

## 2022-04-28 NOTE — Telephone Encounter (Signed)
Reached out to pt to sch home visit.  No answer, I sent her text also asking her to call me back.   Marylouise Stacks, [place] 04/28/2022

## 2022-04-29 ENCOUNTER — Telehealth (HOSPITAL_COMMUNITY): Payer: Self-pay

## 2022-04-29 ENCOUNTER — Other Ambulatory Visit (HOSPITAL_COMMUNITY): Payer: Self-pay

## 2022-04-29 ENCOUNTER — Encounter (HOSPITAL_COMMUNITY): Payer: Self-pay

## 2022-04-29 ENCOUNTER — Telehealth (HOSPITAL_COMMUNITY): Payer: Self-pay | Admitting: Licensed Clinical Social Worker

## 2022-04-29 LAB — VITAMIN B12: Vitamin B-12: 1116 pg/mL (ref 232–1245)

## 2022-04-29 LAB — HCV AB W REFLEX TO QUANT PCR: HCV Ab: NONREACTIVE

## 2022-04-29 LAB — VITAMIN D 25 HYDROXY (VIT D DEFICIENCY, FRACTURES): Vit D, 25-Hydroxy: 38.4 ng/mL (ref 30.0–100.0)

## 2022-04-29 LAB — HCV INTERPRETATION

## 2022-04-29 NOTE — Telephone Encounter (Signed)
H&V Care Navigation CSW Progress Note  Clinical Social Worker called pt to help connect with paramedic as they have been having trouble completing a home visit.    Pt reports she does not wish to participate in the paramedicine program at this time as it causes her additional stress.  States she understands her medications and how to take them and doesn't feel as if she needs the additional assistance at home.  Pt being DC'd from the program at her request.  [place]: No Food Insecurity (03/26/2022)  Housing: Low Risk  (03/26/2022)  Transportation Needs: Unmet Transportation Needs (03/26/2022)  Utilities: Not At Risk (03/26/2022)  Financial Resource Strain: Low Risk  (03/26/2022)  Tobacco Use: High Risk (04/27/2022)     Jorge Ny, [facility] Desk#: [phone] Cell#: [phone]

## 2022-04-29 NOTE — Telephone Encounter (Signed)
Attempted to call patient in regards to Cardiac Rehab - LM on VM Mailed letter 

## 2022-04-30 ENCOUNTER — Other Ambulatory Visit (HOSPITAL_COMMUNITY): Payer: Self-pay

## 2022-05-04 ENCOUNTER — Telehealth (INDEPENDENT_AMBULATORY_CARE_PROVIDER_SITE_OTHER): Payer: Self-pay

## 2022-05-04 NOTE — Telephone Encounter (Signed)
Contacted pt to go over lab results pt is aware and doesn't have any questions or concerns 

## 2022-05-08 NOTE — Progress Notes (Signed)
ADVANCED HF CLINIC NOTE  Primary Care: Juluis Mire, NP HF Cardiologist: Dr. Aundra Dubin  HPI: 72 y.o. female with history of HTN and smoking, with a new history of CAD and systolic heart failure/iCM.  She was admitted 2/24 with CP. EKG with anteroseptal and lateral ST elevation. She was taken to the cath lab, showing 90% left main, occluded LAD, 70% pLCx, 60% pRCA. She had DES to left main and PTCA extensively in the LAD with diffuse 70% residual proximal LAD stenosis. RHC showed significantly elevated right and left heart filling pressures, IABP was placed and patient received IV Lasix. Echo showed EF 20-25%, normal RV. AHF consulted. She continued diuresis with IV lasix, IABP eventually removed. GDMT titrated, she was fitted for LifeVest and discharged home, weight 213 lbs.  Today she returns for HF follow up. Overall feeling fine. She remains fatigued, but better since discharge. She is not SOB walking on flat ground or carrying grocery bags. Denies palpitations, CP, dizziness, edema, or PND/Orthopnea. Appetite ok. No fever or chills. She is not weighing at home. Taking all medications. Wearing LifeVest but eager to remove.  ECG (personally reviewed): none ordered today.  LifeVest interrogation: no treatments, average HR 73 bpm, average daily steps 1476  Labs (2/24): K 3.9, creatinine 1.02, hgb 10 Labs (3/24): K 3.5, creatinine 1.39  Cardiac Studies  - R/LHC (2/24): 90% left main, occluded LAD, 70% pLCx, 60% pRCA.  She had DES to left main and PTCA extensively in the LAD with diffuse 70% residual proximal LAD stenosis.  RA 20, PA 49/32 (40), PCWP 33, CI from swan 2.35  - Echo (2/24):EF 20-25%, no LV thrombus, normal RV, IVC normal, trivial MR  No past medical history on file.  Current Outpatient Medications  Medication Sig Dispense Refill   aspirin EC 81 MG tablet Take 1 tablet (81 mg total) by mouth daily. Swallow whole. 30 tablet 12   atorvastatin (LIPITOR) 80 MG tablet Take 1  tablet (80 mg total) by mouth daily. 30 tablet 5   carvedilol (COREG) 6.25 MG tablet Take 1 tablet (6.25 mg total) by mouth 2 (two) times daily. 180 tablet 3   dapagliflozin propanediol (FARXIGA) 10 MG TABS tablet Take 1 tablet (10 mg total) by mouth daily. 30 tablet 5   digoxin (LANOXIN) 0.125 MG tablet Take 0.5 tablets (0.0625 mg total) by mouth daily. 30 tablet 5   nitroGLYCERIN (NITROSTAT) 0.4 MG SL tablet Place 1 tablet (0.4 mg total) under the tongue every 5 (five) minutes x 3 doses as needed for chest pain. 25 tablet 12   pantoprazole (PROTONIX) 40 MG tablet Take 1 tablet (40 mg total) by mouth daily. 30 tablet 5   sacubitril-valsartan (ENTRESTO) 24-26 MG Take 1 tablet by mouth 2 (two) times daily. 60 tablet 5   spironolactone (ALDACTONE) 25 MG tablet Take 1 tablet (25 mg total) by mouth daily. 30 tablet 5   ticagrelor (BRILINTA) 90 MG TABS tablet Take 1 tablet (90 mg total) by mouth 2 (two) times daily. 60 tablet 11   No current facility-administered medications for this encounter.   No Known Allergies  Social History   Socioeconomic History   Marital status: Single    Spouse name: Not on file   Number of children: Not on file   Years of education: Not on file   Highest education level: Not on file  Occupational History   Not on file  Tobacco Use   Smoking status: Every Day   Smokeless tobacco: Never  Tobacco comments:    5-7 cigs/day  Vaping Use   Vaping Use: Never used  Substance and Sexual Activity   Alcohol use: Not Currently   Drug use: Not on file   Sexual activity: Not on file  Other Topics Concern   Not on file  Social History Narrative   Not on file   Social Determinants of Health   Financial Resource Strain: Low Risk  (03/26/2022)   Overall Financial Resource Strain (CARDIA)    Difficulty of Paying Living Expenses: Not very hard  Food Insecurity: No Food Insecurity (03/26/2022)   Hunger Vital Sign    Worried About Running Out of Food in the Last Year:  Never true    Ran Out of Food in the Last Year: Never true  Transportation Needs: Unmet Transportation Needs (03/26/2022)   PRAPARE - Hydrologist (Medical): Yes    Lack of Transportation (Non-Medical): Yes  Physical Activity: Not on file  Stress: Not on file  Social Connections: Not on file  Intimate Partner Violence: Not At Risk (03/26/2022)   Humiliation, Afraid, Rape, and Kick questionnaire    Fear of Current or Ex-Partner: No    Emotionally Abused: No    Physically Abused: No    Sexually Abused: No   No family history on file.  BP 102/76   Pulse 81   Wt 96.4 kg (212 lb 9.6 oz)   SpO2 99%   BMI 37.66 kg/m   Wt Readings from Last 3 Encounters:  05/12/22 96.4 kg (212 lb 9.6 oz)  04/27/22 95.8 kg (211 lb 3.2 oz)  04/14/22 96.2 kg (212 lb)   PHYSICAL EXAM: General:  NAD. No resp difficulty, walked into clinic HEENT: Normal Neck: Supple. No JVD. Carotids 2+ bilat; no bruits. No lymphadenopathy or thryomegaly appreciated. Cor: PMI nondisplaced. Regular rate & rhythm. No rubs, gallops or murmurs. Lungs: Clear Abdomen: Soft, obese, nontender, nondistended. No hepatosplenomegaly. No bruits or masses. Good bowel sounds. Extremities: No cyanosis, clubbing, rash, edema Neuro: Alert & oriented x 3, cranial nerves grossly intact. Moves all 4 extremities w/o difficulty. Affect pleasant.  ASSESSMENT & PLAN: 1. CAD: Anterior STEMI (2/24), suspect late presentation given several days of stuttering symptoms. Cath showed 90% left main, occluded LAD, 70% pLCx, 60% pRCA.  She had DES to left main and PTCA extensively in the LAD with diffuse 70% residual proximal LAD stenosis.  She has significant diffuse residual LAD disease, case discussed with Dr. Burt Knack and not planned for relook cath at this time.  No chest pain.   - Continue ASA 81/ticagrelor.  - Continue atorvastatin 80 mg daily.  - Referred to CR. Encouraged her to participate. 2. Chronic systolic heart  failure:  Ischemic cardiomyopathy.  Echo showed EF 20-25%, no LV thrombus, normal RV, IVC normal, trivial MR. Required IABP and inotropes during admission. NYHA II-early III, she is not volume overloaded today. Mostly limited by fatigue.  - Will give Rx for Lasix 20 mg PRN - Continue Coreg 6.25 mg bid - Continue Farxiga 10 mg daily. BMET and BNP today.  - Continue Entresto 24/26 mg bid - Continue digoxin 0.0625 daily. Check dig level today. - Continue spiro 25 mg daily. - Continue LifeVest, discussed importance of wearing. - Repeat echo in 6-8 weeks. 3. HTN: BP stable. - GDMT per above.  4. Smoking: Back smoking, < 3 cigs/week. - Discussed cessation. 5. IDA: Ferritin 68. Tsat 9% - given IV Fe during admission. 6. SDOH: Poor insight into  health conditions. She has refused paramedicine.   Follow up in 6-8 weeks with Dr. Aundra Dubin + echo.  Ben Bolt, FNP-BC. 05/12/22

## 2022-05-12 ENCOUNTER — Ambulatory Visit (HOSPITAL_COMMUNITY)
Admission: RE | Admit: 2022-05-12 | Discharge: 2022-05-12 | Disposition: A | Discharge status: Home / Self Care | Payer: 59 | Source: Ambulatory Visit | Attending: Family Medicine | Admitting: Family Medicine

## 2022-05-12 ENCOUNTER — Encounter (HOSPITAL_COMMUNITY): Payer: Self-pay

## 2022-05-12 VITALS — BP 102/76 | HR 81 | Wt 212.6 lb

## 2022-05-12 DIAGNOSIS — I10 Essential (primary) hypertension: Secondary | ICD-10-CM | POA: Diagnosis not present

## 2022-05-12 DIAGNOSIS — Z7902 Long term (current) use of antithrombotics/antiplatelets: Secondary | ICD-10-CM | POA: Diagnosis not present

## 2022-05-12 DIAGNOSIS — Z720 Tobacco use: Secondary | ICD-10-CM

## 2022-05-12 DIAGNOSIS — I5022 Chronic systolic (congestive) heart failure: Secondary | ICD-10-CM | POA: Insufficient documentation

## 2022-05-12 DIAGNOSIS — Z139 Encounter for screening, unspecified: Secondary | ICD-10-CM | POA: Diagnosis not present

## 2022-05-12 DIAGNOSIS — Z955 Presence of coronary angioplasty implant and graft: Secondary | ICD-10-CM | POA: Diagnosis not present

## 2022-05-12 DIAGNOSIS — D509 Iron deficiency anemia, unspecified: Secondary | ICD-10-CM | POA: Diagnosis not present

## 2022-05-12 DIAGNOSIS — Z79899 Other long term (current) drug therapy: Secondary | ICD-10-CM | POA: Diagnosis not present

## 2022-05-12 DIAGNOSIS — R079 Chest pain, unspecified: Secondary | ICD-10-CM | POA: Diagnosis not present

## 2022-05-12 DIAGNOSIS — I110 Hypertensive heart disease with heart failure: Secondary | ICD-10-CM | POA: Insufficient documentation

## 2022-05-12 DIAGNOSIS — R9431 Abnormal electrocardiogram [ECG] [EKG]: Secondary | ICD-10-CM | POA: Insufficient documentation

## 2022-05-12 DIAGNOSIS — F17200 Nicotine dependence, unspecified, uncomplicated: Secondary | ICD-10-CM | POA: Insufficient documentation

## 2022-05-12 DIAGNOSIS — R5383 Other fatigue: Secondary | ICD-10-CM | POA: Insufficient documentation

## 2022-05-12 DIAGNOSIS — I2510 Atherosclerotic heart disease of native coronary artery without angina pectoris: Secondary | ICD-10-CM | POA: Insufficient documentation

## 2022-05-12 LAB — BASIC METABOLIC PANEL
Anion gap: 11 (ref 5–15)
BUN: 7 mg/dL — ABNORMAL LOW (ref 8–23)
CO2: 20 mmol/L — ABNORMAL LOW (ref 22–32)
Calcium: 9 mg/dL (ref 8.9–10.3)
Chloride: 109 mmol/L (ref 98–111)
Creatinine, Ser: 1.19 mg/dL — ABNORMAL HIGH (ref 0.44–1.00)
GFR, Estimated: 49 mL/min — ABNORMAL LOW (ref >60)
Glucose, Bld: 116 mg/dL — ABNORMAL HIGH (ref 70–99)
Potassium: 3.4 mmol/L — ABNORMAL LOW (ref 3.5–5.1)
Sodium: 140 mmol/L (ref 135–145)

## 2022-05-12 LAB — DIGOXIN LEVEL: Digoxin Level: 0.5 ng/mL — ABNORMAL LOW (ref 0.8–2.0)

## 2022-05-12 LAB — BRAIN NATRIURETIC PEPTIDE: B Natriuretic Peptide: 641.5 pg/mL — ABNORMAL HIGH (ref 0.0–100.0)

## 2022-05-12 MED ORDER — FUROSEMIDE 20 MG PO TABS: 20.0000 mg | ORAL_TABLET | ORAL | 11 refills | Status: DC | PRN

## 2022-05-12 NOTE — Patient Instructions (Signed)
START Lasix 20 mg one tab as needed for 3 lb weight gain overnight or 5 lb weight gain in one week.  Labs today We will only contact you if something comes back abnormal or we need to make some changes. Otherwise no news is good news!  Your physician recommends that you schedule a follow-up appointment in: 6-8 weeks with Dr Aundra Dubin And echo  Your physician has requested that you have an echocardiogram. Echocardiography is a painless test that uses sound waves to create images of your heart. It provides your doctor with information about the size and shape of your heart and how well your heart's chambers and valves are working. This procedure takes approximately one hour. There are no restrictions for this procedure. Please do NOT wear cologne, perfume, aftershave, or lotions (deodorant is allowed). Please arrive 15 minutes prior to your appointment time.  Do the following things EVERYDAY: Weigh yourself in the morning before breakfast. Write it down and keep it in a log. Take your medicines as prescribed Eat low salt foods--Limit salt (sodium) to 2000 mg per day.  Stay as active as you can everyday Limit all fluids for the day to less than 2 liters  At the [facility], you and your health needs are our priority. As part of our continuing mission to provide you with exceptional heart care, we have created designated Provider Care Teams. These Care Teams include your primary Cardiologist (physician) and Advanced Practice Providers (APPs- Physician Assistants and Nurse Practitioners) who all work together to provide you with the care you need, when you need it.   You may see any of the following providers on your designated Care Team at your next follow up: Dr Glori Bickers Dr Loralie Champagne Dr. Roxana Hires, NP Lyda Jester, [place] [facility], [place] Forestine Na, NP Audry Riles, PharmD   Please be sure to bring in all your medications  bottles to every appointment.    Thank you for choosing [facility]   If you have any questions or concerns before your next appointment please send [place] a message through [place] or call our office at [phone].    TO LEAVE A MESSAGE FOR THE NURSE SELECT OPTION 2, PLEASE LEAVE A MESSAGE INCLUDING: YOUR NAME DATE OF BIRTH CALL BACK NUMBER REASON FOR CALL**this is important as we prioritize the call backs  YOU WILL RECEIVE A CALL BACK THE SAME DAY AS LONG AS YOU CALL BEFORE 4:00 PM

## 2022-05-13 ENCOUNTER — Telehealth (HOSPITAL_COMMUNITY): Payer: Self-pay

## 2022-05-13 DIAGNOSIS — I5022 Chronic systolic (congestive) heart failure: Secondary | ICD-10-CM

## 2022-05-13 MED ORDER — POTASSIUM CHLORIDE CRYS ER 20 MEQ PO TBCR: 20.0000 meq | EXTENDED_RELEASE_TABLET | Freq: Every day | ORAL | 3 refills | Status: DC

## 2022-05-13 NOTE — Telephone Encounter (Addendum)
Pt aware, agreeable, and verbalized understanding  Labs ordered Rx sent   ----- Message from Rafael Bihari, FNP sent at 05/12/2022  3:56 PM EDT ----- Labs stable but K is low.  Please start 20 KCL daily. Repeat BMET in 2 weeks

## 2022-05-27 ENCOUNTER — Telehealth (HOSPITAL_COMMUNITY): Payer: Self-pay

## 2022-05-27 ENCOUNTER — Ambulatory Visit (HOSPITAL_COMMUNITY)
Admission: RE | Admit: 2022-05-27 | Discharge: 2022-05-27 | Disposition: A | Discharge status: Home / Self Care | Payer: 59 | Source: Ambulatory Visit | Attending: Internal Medicine | Admitting: Internal Medicine

## 2022-05-27 ENCOUNTER — Other Ambulatory Visit (HOSPITAL_COMMUNITY): Payer: Self-pay | Admitting: Cardiology

## 2022-05-27 DIAGNOSIS — I5022 Chronic systolic (congestive) heart failure: Secondary | ICD-10-CM | POA: Diagnosis not present

## 2022-05-27 DIAGNOSIS — I213 ST elevation (STEMI) myocardial infarction of unspecified site: Secondary | ICD-10-CM | POA: Diagnosis not present

## 2022-05-27 DIAGNOSIS — I420 Dilated cardiomyopathy: Secondary | ICD-10-CM | POA: Diagnosis not present

## 2022-05-27 LAB — BASIC METABOLIC PANEL
Anion gap: 13 (ref 5–15)
BUN: 23 mg/dL (ref 8–23)
CO2: 21 mmol/L — ABNORMAL LOW (ref 22–32)
Calcium: 10.1 mg/dL (ref 8.9–10.3)
Chloride: 102 mmol/L (ref 98–111)
Creatinine, Ser: 1.57 mg/dL — ABNORMAL HIGH (ref 0.44–1.00)
GFR, Estimated: 35 mL/min — ABNORMAL LOW (ref >60)
Glucose, Bld: 171 mg/dL — ABNORMAL HIGH (ref 70–99)
Potassium: 4 mmol/L (ref 3.5–5.1)
Sodium: 136 mmol/L (ref 135–145)

## 2022-05-27 MED ORDER — ASPIRIN 81 MG PO TBEC: 81.0000 mg | DELAYED_RELEASE_TABLET | Freq: Every day | ORAL | 12 refills | Status: DC

## 2022-05-27 MED ORDER — ATORVASTATIN CALCIUM 80 MG PO TABS: 80.0000 mg | ORAL_TABLET | Freq: Every day | ORAL | 5 refills | Status: DC

## 2022-05-27 NOTE — Telephone Encounter (Signed)
-----   Message from Jacklynn Ganong, [place] sent at 05/27/2022  4:34 PM EDT ----- Kidney function elevated  Repeat BMET in 2 weeks to follow

## 2022-05-27 NOTE — Telephone Encounter (Signed)
Lab order has been placed and appointment scheduled. Pt aware, agreeable, and verbalized understanding.

## 2022-05-28 ENCOUNTER — Other Ambulatory Visit (INDEPENDENT_AMBULATORY_CARE_PROVIDER_SITE_OTHER): Payer: Self-pay | Admitting: Primary Care

## 2022-05-28 NOTE — Telephone Encounter (Signed)
Medication Refill - Medication:  sacubitril-valsartan (ENTRESTO) 24-26 MG atorvastatin (LIPITOR) 80 MG tablet digoxin (LANOXIN) 0.125 MG tablet ticagrelor (BRILINTA) 90 MG TABS tablet dapagliflozin propanediol (FARXIGA) 10 MG TABS tablet  Has the patient contacted their pharmacy? No. Pharmacy was not called as she wants it sent to a different pharmacy  Preferred Pharmacy (with phone number or street name):  [id number] - [place], [place] - [address] AT [facility] OF [address]  Phone: [phone] Fax: [phone]  Has the patient been seen for an appointment in the last year OR does the patient have an upcoming appointment? Yes  Agent: Please be advised that RX refills may take up to 3 business days. We ask that you follow-up with your pharmacy.  Patient took her last pill earlier this morning and have nothing for tonight

## 2022-05-29 ENCOUNTER — Telehealth: Payer: Self-pay | Admitting: General Practice

## 2022-05-29 ENCOUNTER — Other Ambulatory Visit (HOSPITAL_COMMUNITY): Payer: Self-pay

## 2022-05-29 DIAGNOSIS — I5022 Chronic systolic (congestive) heart failure: Secondary | ICD-10-CM

## 2022-05-29 MED ORDER — ATORVASTATIN CALCIUM 80 MG PO TABS: 80.0000 mg | ORAL_TABLET | Freq: Every day | ORAL | 5 refills | Status: DC

## 2022-05-29 MED ORDER — DIGOXIN 125 MCG PO TABS: 0.0625 mg | ORAL_TABLET | Freq: Every day | ORAL | 5 refills | Status: DC

## 2022-05-29 MED ORDER — TICAGRELOR 90 MG PO TABS: 90.0000 mg | ORAL_TABLET | Freq: Two times a day (BID) | ORAL | 11 refills | Status: DC

## 2022-05-29 MED ORDER — PANTOPRAZOLE SODIUM 40 MG PO TBEC
40.0000 mg | DELAYED_RELEASE_TABLET | Freq: Every day | ORAL | 5 refills | Status: DC
  Filled 2022-06-30 – 2022-07-01 (×2): qty 30, 30d supply, fill #0

## 2022-05-29 MED ORDER — DAPAGLIFLOZIN PROPANEDIOL 10 MG PO TABS: 10.0000 mg | ORAL_TABLET | Freq: Every day | ORAL | 5 refills | Status: DC

## 2022-05-29 MED ORDER — SPIRONOLACTONE 25 MG PO TABS
25.0000 mg | ORAL_TABLET | Freq: Every day | ORAL | 5 refills | Status: DC
Start: 2022-05-29 — End: 2022-06-30

## 2022-05-29 MED ORDER — SACUBITRIL-VALSARTAN 24-26 MG PO TABS: 1.0000 | ORAL_TABLET | Freq: Two times a day (BID) | ORAL | 5 refills | Status: DC

## 2022-05-29 NOTE — Telephone Encounter (Signed)
Copied from CRM [id number]. Topic: General - Other >> May 29, 2022  8:24 AM Franchot Heidelberg wrote: Reason for CRM: Pt called to request that her PCP calls her back today because she says she needs her medication. Says she is recovering from a massive heart attack that put her in the hospital. Says she is waiting on her medicine for her heart.   Best contact: [phone]  Says she is requesting a call back urgently

## 2022-05-29 NOTE — Telephone Encounter (Signed)
Unable to refill per protocol, last refill by another provider 05/29/22.  Requested Prescriptions  Pending Prescriptions Disp Refills   sacubitril-valsartan (ENTRESTO) 24-26 MG 60 tablet 5    Sig: Take 1 tablet by mouth 2 (two) times daily.     Off-Protocol Failed - 05/28/2022  4:50 PM      Failed - Medication not assigned to a protocol, review manually.      Passed - Valid encounter within last 12 months    Recent Outpatient Visits           1 month ago Encounter to establish care   [place] [facility] Grayce Sessions, NP       Future Appointments             In 2 months Grayce Sessions, NP [place] [facility]             atorvastatin (LIPITOR) 80 MG tablet 30 tablet 5    Sig: Take 1 tablet (80 mg total) by mouth daily.     Cardiovascular:  Antilipid - Statins Failed - 05/28/2022  4:50 PM      Failed - Lipid Panel in normal range within the last 12 months    Cholesterol  Date Value Ref Range Status  03/21/2022 206 (H) 0 - 200 mg/dL Final   LDL Cholesterol  Date Value Ref Range Status  03/21/2022 133 (H) 0 - 99 mg/dL Final    Comment:           Total Cholesterol/HDL:CHD Risk Coronary Heart Disease Risk Table                     Men   Women  1/2 Average Risk   3.4   3.3  Average Risk       5.0   4.4  2 X Average Risk   9.6   7.1  3 X Average Risk  23.4   11.0        Use the calculated Patient Ratio above and the CHD Risk Table to determine the patient's CHD Risk.        ATP III CLASSIFICATION (LDL):  <100     mg/dL   Optimal  161-096  mg/dL   Near or Above                    Optimal  130-159  mg/dL   Borderline  045-409  mg/dL   High  >811     mg/dL   Very High Performed at [facility], [address], [place], [place] [address]    HDL  Date Value Ref Range Status  03/21/2022 53 >40 mg/dL Final   Triglycerides  Date Value Ref Range Status  03/21/2022 102 <150 mg/dL Final         Passed -  Patient is not pregnant      Passed - Valid encounter within last 12 months    Recent Outpatient Visits           1 month ago Encounter to establish care    [facility] Grayce Sessions, NP       Future Appointments             In 2 months Randa Evens Kinnie Scales, NP  [facility]             digoxin (LANOXIN) 0.125 MG tablet 30 tablet 5    Sig:  Take 0.5 tablets (0.0625 mg total) by mouth daily.     Cardiovascular:  Antiarrhythmic Agents - digoxin Failed - 05/28/2022  4:50 PM      Failed - Cr in normal range and within 180 days    Creatinine, Ser  Date Value Ref Range Status  05/27/2022 1.57 (H) 0.44 - 1.00 mg/dL Final         Failed - Digoxin (serum) in normal range and within 360 days    Digoxin Level  Date Value Ref Range Status  05/12/2022 0.5 (L) 0.8 - 2.0 ng/mL Final    Comment:    Performed at [facility], [address], [place], [place] [address]         Failed - Mg Level in normal range and within 360 days    No results found for: "MG"       Passed - Ca in normal range and within 360 days    Calcium  Date Value Ref Range Status  05/27/2022 10.1 8.9 - 10.3 mg/dL Final   Calcium, Ion  Date Value Ref Range Status  03/20/2022 1.21 1.15 - 1.40 mmol/L Final         Passed - K in normal range and within 180 days    Potassium  Date Value Ref Range Status  05/27/2022 4.0 3.5 - 5.1 mmol/L Final         Passed - Patient had ECG in the last 360 days      Passed - Last Heart Rate in normal range    Pulse Readings from Last 1 Encounters:  05/12/22 81         Passed - Valid encounter within last 6 months    Recent Outpatient Visits           1 month ago Encounter to establish care   [place] [facility] Grayce Sessions, NP       Future Appointments             In 2 months Grayce Sessions, NP [place] [facility]             ticagrelor  (BRILINTA) 90 MG TABS tablet 60 tablet 11    Sig: Take 1 tablet (90 mg total) by mouth 2 (two) times daily.     Hematology: Antiplatelets - ticagrelor Failed - 05/28/2022  4:50 PM      Failed - Cr in normal range and within 180 days    Creatinine, Ser  Date Value Ref Range Status  05/27/2022 1.57 (H) 0.44 - 1.00 mg/dL Final         Failed - HCT in normal range and within 180 days    HCT  Date Value Ref Range Status  03/28/2022 29.7 (L) 36.0 - 46.0 % Final         Failed - HGB in normal range and within 180 days    Hemoglobin  Date Value Ref Range Status  03/28/2022 10.0 (L) 12.0 - 15.0 g/dL Final   Total hemoglobin  Date Value Ref Range Status  03/23/2022 10.4 (L) 12.0 - 16.0 g/dL Final         Passed - PLT in normal range and within 180 days    Platelets  Date Value Ref Range Status  03/28/2022 302 150 - 400 K/uL Final         Passed - Last Heart Rate in normal range    Pulse Readings from Last 1 Encounters:  05/12/22 81         Passed - Valid encounter within last 6 months    Recent Outpatient Visits           1 month ago Encounter to establish care   [place] [facility] Grayce Sessions, NP       Future Appointments             In 2 months Grayce Sessions, NP [place] [facility]             dapagliflozin propanediol (FARXIGA) 10 MG TABS tablet 30 tablet 5    Sig: Take 1 tablet (10 mg total) by mouth daily.     Endocrinology:  Diabetes - SGLT2 Inhibitors Failed - 05/28/2022  4:50 PM      Failed - Cr in normal range and within 360 days    Creatinine, Ser  Date Value Ref Range Status  05/27/2022 1.57 (H) 0.44 - 1.00 mg/dL Final         Failed - eGFR in normal range and within 360 days    GFR, Estimated  Date Value Ref Range Status  05/27/2022 35 (L) >60 mL/min Final    Comment:    (NOTE) Calculated using the CKD-EPI Creatinine Equation (2021)          Passed - HBA1C is between 0 and 7.9 and  within 180 days    Hgb A1c MFr Bld  Date Value Ref Range Status  03/21/2022 5.2 4.8 - 5.6 % Final    Comment:    (NOTE) Pre diabetes:          5.7%-6.4%  Diabetes:              >6.4%  Glycemic control for   <7.0% adults with diabetes          Passed - Valid encounter within last 6 months    Recent Outpatient Visits           1 month ago Encounter to establish care   [place] [facility] Grayce Sessions, NP       Future Appointments             In 2 months Randa Evens, Kinnie Scales, NP [place] [facility]

## 2022-05-29 NOTE — Telephone Encounter (Signed)
These medications are prescribed by her team at the [facility] clinic. Provided them with the number for contact.

## 2022-06-03 ENCOUNTER — Other Ambulatory Visit: Payer: Self-pay

## 2022-06-04 ENCOUNTER — Other Ambulatory Visit: Payer: Self-pay

## 2022-06-11 ENCOUNTER — Ambulatory Visit (HOSPITAL_COMMUNITY)
Admission: RE | Admit: 2022-06-11 | Discharge: 2022-06-11 | Disposition: A | Discharge status: Home / Self Care | Payer: 59 | Source: Ambulatory Visit | Attending: Family Medicine | Admitting: Family Medicine

## 2022-06-11 ENCOUNTER — Ambulatory Visit (HOSPITAL_COMMUNITY)
Admission: RE | Admit: 2022-06-11 | Discharge: 2022-06-11 | Disposition: A | Discharge status: Home / Self Care | Payer: 59 | Source: Ambulatory Visit | Attending: Cardiology | Admitting: Cardiology

## 2022-06-11 DIAGNOSIS — I5022 Chronic systolic (congestive) heart failure: Secondary | ICD-10-CM

## 2022-06-11 DIAGNOSIS — I110 Hypertensive heart disease with heart failure: Secondary | ICD-10-CM | POA: Insufficient documentation

## 2022-06-11 DIAGNOSIS — I2510 Atherosclerotic heart disease of native coronary artery without angina pectoris: Secondary | ICD-10-CM | POA: Insufficient documentation

## 2022-06-11 LAB — BASIC METABOLIC PANEL
Anion gap: 9 (ref 5–15)
BUN: 15 mg/dL (ref 8–23)
CO2: 18 mmol/L — ABNORMAL LOW (ref 22–32)
Calcium: 9.4 mg/dL (ref 8.9–10.3)
Chloride: 109 mmol/L (ref 98–111)
Creatinine, Ser: 1.45 mg/dL — ABNORMAL HIGH (ref 0.44–1.00)
GFR, Estimated: 39 mL/min — ABNORMAL LOW (ref >60)
Glucose, Bld: 124 mg/dL — ABNORMAL HIGH (ref 70–99)
Potassium: 4 mmol/L (ref 3.5–5.1)
Sodium: 136 mmol/L (ref 135–145)

## 2022-06-11 LAB — ECHOCARDIOGRAM COMPLETE
Area-P 1/2: 2.66 cm2
Calc EF: 43.9 %
S' Lateral: 2.5 cm
Single Plane A2C EF: 45.6 %
Single Plane A4C EF: 43.9 %

## 2022-06-11 NOTE — Progress Notes (Signed)
Echocardiogram 2D Echocardiogram has been performed.  Toni Amend 06/11/2022, 2:56 PM

## 2022-06-12 ENCOUNTER — Telehealth (HOSPITAL_COMMUNITY): Payer: Self-pay

## 2022-06-12 NOTE — Telephone Encounter (Signed)
Patient aware of echo results.

## 2022-06-12 NOTE — Telephone Encounter (Signed)
I spoke with [employer] to discontinue life vest and they are calling her to retrieve

## 2022-06-30 ENCOUNTER — Ambulatory Visit (HOSPITAL_COMMUNITY)
Admission: RE | Admit: 2022-06-30 | Discharge: 2022-06-30 | Disposition: A | Discharge status: Home / Self Care | Payer: 59 | Source: Ambulatory Visit | Attending: Cardiology | Admitting: Cardiology

## 2022-06-30 ENCOUNTER — Other Ambulatory Visit: Payer: Self-pay

## 2022-06-30 ENCOUNTER — Other Ambulatory Visit (HOSPITAL_COMMUNITY): Payer: Self-pay

## 2022-06-30 ENCOUNTER — Encounter (HOSPITAL_COMMUNITY): Payer: Self-pay | Admitting: Cardiology

## 2022-06-30 VITALS — BP 90/50 | HR 88 | Wt 205.2 lb

## 2022-06-30 DIAGNOSIS — I252 Old myocardial infarction: Secondary | ICD-10-CM | POA: Diagnosis not present

## 2022-06-30 DIAGNOSIS — I2510 Atherosclerotic heart disease of native coronary artery without angina pectoris: Secondary | ICD-10-CM | POA: Insufficient documentation

## 2022-06-30 DIAGNOSIS — Z79899 Other long term (current) drug therapy: Secondary | ICD-10-CM | POA: Diagnosis not present

## 2022-06-30 DIAGNOSIS — I5022 Chronic systolic (congestive) heart failure: Secondary | ICD-10-CM | POA: Insufficient documentation

## 2022-06-30 DIAGNOSIS — Z955 Presence of coronary angioplasty implant and graft: Secondary | ICD-10-CM | POA: Diagnosis not present

## 2022-06-30 DIAGNOSIS — F17200 Nicotine dependence, unspecified, uncomplicated: Secondary | ICD-10-CM | POA: Diagnosis not present

## 2022-06-30 DIAGNOSIS — I110 Hypertensive heart disease with heart failure: Secondary | ICD-10-CM | POA: Diagnosis not present

## 2022-06-30 MED ORDER — SPIRONOLACTONE 25 MG PO TABS
25.0000 mg | ORAL_TABLET | Freq: Every day | ORAL | 3 refills | Status: DC
Start: 2022-06-30 — End: 2022-07-07
  Filled 2022-06-30 – 2022-07-01 (×4): qty 90, 90d supply, fill #0
  Filled ????-??-??: fill #0

## 2022-06-30 MED ORDER — ATORVASTATIN CALCIUM 80 MG PO TABS
80.0000 mg | ORAL_TABLET | Freq: Every day | ORAL | 0 refills | Status: DC
  Filled 2022-06-30 – 2022-07-01 (×4): qty 90, 90d supply, fill #0
  Filled ????-??-??: fill #0

## 2022-06-30 MED ORDER — TICAGRELOR 90 MG PO TABS
90.0000 mg | ORAL_TABLET | Freq: Two times a day (BID) | ORAL | 0 refills | Status: DC
  Filled 2022-06-30 – 2022-07-01 (×4): qty 180, 90d supply, fill #0
  Filled ????-??-??: fill #0

## 2022-06-30 MED ORDER — ASPIRIN 81 MG PO TBEC
81.0000 mg | DELAYED_RELEASE_TABLET | Freq: Every day | ORAL | 3 refills | Status: DC
  Filled 2022-06-30 (×2): qty 90, 90d supply, fill #0

## 2022-06-30 MED ORDER — FUROSEMIDE 20 MG PO TABS
20.0000 mg | ORAL_TABLET | ORAL | 11 refills | Status: DC | PRN
  Filled 2022-06-30 (×3): qty 30, 30d supply, fill #0

## 2022-06-30 MED ORDER — VARENICLINE TARTRATE 0.5 MG PO TABS
ORAL_TABLET | ORAL | 0 refills | Status: DC
  Filled 2022-06-30 (×7): qty 53, 17d supply, fill #0
  Filled 2022-07-01: qty 53, 28d supply, fill #0

## 2022-06-30 MED ORDER — POTASSIUM CHLORIDE CRYS ER 20 MEQ PO TBCR
20.0000 meq | EXTENDED_RELEASE_TABLET | Freq: Every day | ORAL | 3 refills | Status: DC
  Filled 2022-06-30 (×3): qty 90, 90d supply, fill #0

## 2022-06-30 MED ORDER — CARVEDILOL 6.25 MG PO TABS
6.2500 mg | ORAL_TABLET | Freq: Two times a day (BID) | ORAL | 3 refills | Status: DC
  Filled 2022-06-30 (×2): qty 180, 90d supply, fill #0

## 2022-06-30 MED ORDER — SACUBITRIL-VALSARTAN 24-26 MG PO TABS
1.0000 | ORAL_TABLET | Freq: Two times a day (BID) | ORAL | 3 refills | Status: DC
  Filled 2022-06-30 – 2022-07-01 (×5): qty 180, 90d supply, fill #0
  Filled ????-??-?? (×2): fill #0

## 2022-06-30 MED ORDER — DAPAGLIFLOZIN PROPANEDIOL 10 MG PO TABS
10.0000 mg | ORAL_TABLET | Freq: Every day | ORAL | 3 refills | Status: DC
  Filled 2022-06-30 (×3): qty 90, 90d supply, fill #0
  Filled ????-??-??: fill #0

## 2022-06-30 NOTE — Patient Instructions (Signed)
PLEASE GO AND PICK UP YOUR PRESCRIPTIONS FROM THE [facility] AND START THEM TODAY.  STOP Digoxin  You have been referred to cardiac rehab. They will call you to arrange your appointment.  Your physician recommends that you schedule a follow-up appointment in: 2 months  If you have any questions or concerns before your next appointment please send [place] a message through [place] or call our office at [phone].    TO LEAVE A MESSAGE FOR THE NURSE SELECT OPTION 2, PLEASE LEAVE A MESSAGE INCLUDING: YOUR NAME DATE OF BIRTH CALL BACK NUMBER REASON FOR CALL**this is important as we prioritize the call backs  YOU WILL RECEIVE A CALL BACK THE SAME DAY AS LONG AS YOU CALL BEFORE 4:00 PM  At the [facility], you and your health needs are our priority. As part of our continuing mission to provide you with exceptional heart care, we have created designated Provider Care Teams. These Care Teams include your primary Cardiologist (physician) and Advanced Practice Providers (APPs- Physician Assistants and Nurse Practitioners) who all work together to provide you with the care you need, when you need it.   You may see any of the following providers on your designated Care Team at your next follow up: Dr Arvilla Meres Dr Marca Ancona Dr. Marcos Eke, NP Robbie Lis, [place] [facility] [place], [place] Brynda Peon, NP Karle Plumber, PharmD   Please be sure to bring in all your medications bottles to every appointment.    Thank you for choosing [facility]

## 2022-07-01 ENCOUNTER — Other Ambulatory Visit (HOSPITAL_COMMUNITY): Payer: Self-pay

## 2022-07-01 ENCOUNTER — Other Ambulatory Visit: Payer: Self-pay

## 2022-07-01 NOTE — Progress Notes (Signed)
ADVANCED HF CLINIC NOTE  Primary Care: Gwinda Passe, NP HF Cardiologist: Dr. Shirlee Latch  HPI: 72 y.o. female with history of HTN and smoking, with a new history of CAD and systolic heart failure/iCM.  She was admitted 2/24 with anterior STEMI. She was taken to the cath lab, showing 90% left main, occluded LAD, 70% pLCx, 60% pRCA. She had DES to left main and PTCA extensively in the LAD with diffuse 70% residual proximal LAD stenosis. RHC showed significantly elevated right and left heart filling pressures, IABP was placed and patient received IV Lasix. Echo showed EF 20-25%, normal RV.  She continued diuresis with IV lasix, IABP eventually removed. GDMT titrated, she was fitted for LifeVest and discharged home, weight 213 lbs.  Repeat echo in 5/24 showed EF 40-45%, mild LVH, normal RV, IVC normal.  She took off the Lifevest.   Today she returns for HF follow up. Some confusion with her meds. She has been out of ticagrelor and atorvastatin, possibly for up to 1 month.  Still smoking but cutting back.  No chest pain.  No exertional dyspnea.  No orthopnea/PND. Overall, feels quite good. Weight down 7 lbs.   ECG (personally reviewed): NSR, LAFB, old ASMI  Labs (2/24): K 3.9, creatinine 1.02, hgb 10 Labs (3/24): K 3.5, creatinine 1.39 Labs (5/24): K 4, creatinine 1.45  PMH: 1. CAD: Anterior STEMI 2/24. LHC with 90% left main, occluded LAD, 70% pLCx, 60% pRCA.  She had DES to left main and PTCA extensively in the LAD with diffuse 70% residual proximal LAD stenosis.  2. HTN 3. Active smoker 4. Chronic systolic CHF: Ischemic cardiomyopathy.  - RHC (2/24): RA 20, PA 49/32 (40), PCWP 33, CI from swan 2.35 - Echo (2/24): EF 20-25%, no LV thrombus, normal RV, IVC normal, trivial MR - Echo (5/24): EF 40-45%, mild LVH, normal RV, IVC normal   Current Outpatient Medications  Medication Sig Dispense Refill   nitroGLYCERIN (NITROSTAT) 0.4 MG SL tablet Place 1 tablet (0.4 mg total) under the tongue  every 5 (five) minutes x 3 doses as needed for chest pain. 25 tablet 12   pantoprazole (PROTONIX) 40 MG tablet Take 1 tablet (40 mg total) by mouth daily. 30 tablet 5   varenicline (CHANTIX) 0.5 MG tablet DAYS 1-3: Take 1 tablet by mouth daily ; DAY 4-7, Take 1 tablet twice daily, Starting DAY 8: Take 2 tablets twice daily. 53 tablet 0   aspirin EC 81 MG tablet Take 1 tablet (81 mg total) by mouth daily. Swallow whole. 90 tablet 3   atorvastatin (LIPITOR) 80 MG tablet Take 1 tablet (80 mg total) by mouth daily. 90 tablet 0   carvedilol (COREG) 6.25 MG tablet Take 1 tablet (6.25 mg total) by mouth 2 (two) times daily. 180 tablet 3   dapagliflozin propanediol (FARXIGA) 10 MG TABS tablet Take 1 tablet (10 mg total) by mouth daily. 90 tablet 3   furosemide (LASIX) 20 MG tablet Take 1 tablet (20 mg total) by mouth as needed. For 3lb weight gain overnight or 5lb weight gain in one week 30 tablet 11   potassium chloride SA (KLOR-CON M) 20 MEQ tablet Take 1 tablet (20 mEq total) by mouth daily. 90 tablet 3   sacubitril-valsartan (ENTRESTO) 24-26 MG Take 1 tablet by mouth 2 (two) times daily. 180 tablet 3   spironolactone (ALDACTONE) 25 MG tablet Take 1 tablet (25 mg total) by mouth daily. 90 tablet 3   ticagrelor (BRILINTA) 90 MG TABS tablet Take 1  tablet (90 mg total) by mouth 2 (two) times daily. 180 tablet 0   No current facility-administered medications for this encounter.   No Known Allergies  Social History   Socioeconomic History   Marital status: Single    Spouse name: Not on file   Number of children: Not on file   Years of education: Not on file   Highest education level: Not on file  Occupational History   Not on file  Tobacco Use   Smoking status: Every Day   Smokeless tobacco: Never   Tobacco comments:    5-7 cigs/day  Vaping Use   Vaping Use: Never used  Substance and Sexual Activity   Alcohol use: Not Currently   Drug use: Not on file   Sexual activity: Not on file  Other  Topics Concern   Not on file  Social History Narrative   Not on file   Social Determinants of Health   Financial Resource Strain: Low Risk  (03/26/2022)   Overall Financial Resource Strain (CARDIA)    Difficulty of Paying Living Expenses: Not very hard  Food Insecurity: No Food Insecurity (03/26/2022)   Hunger Vital Sign    Worried About Running Out of Food in the Last Year: Never true    Ran Out of Food in the Last Year: Never true  Transportation Needs: Unmet Transportation Needs (03/26/2022)   PRAPARE - Administrator, Civil Service (Medical): Yes    Lack of Transportation (Non-Medical): Yes  Physical Activity: Not on file  Stress: Not on file  Social Connections: Not on file  Intimate Partner Violence: Not At Risk (03/26/2022)   Humiliation, Afraid, Rape, and Kick questionnaire    Fear of Current or Ex-Partner: No    Emotionally Abused: No    Physically Abused: No    Sexually Abused: No   History reviewed. No pertinent family history.  BP (!) 90/50   Pulse 88   Wt 93.1 kg (205 lb 3.2 oz)   SpO2 98%   BMI 36.35 kg/m   Wt Readings from Last 3 Encounters:  06/30/22 93.1 kg (205 lb 3.2 oz)  05/12/22 96.4 kg (212 lb 9.6 oz)  04/27/22 95.8 kg (211 lb 3.2 oz)   PHYSICAL EXAM: General: NAD Neck: No JVD, no thyromegaly or thyroid nodule.  Lungs: Clear to auscultation bilaterally with normal respiratory effort. CV: Nondisplaced PMI.  Heart regular S1/S2, no S3/S4, no murmur.  No peripheral edema.  No carotid bruit.  Normal pedal pulses.  Abdomen: Soft, nontender, no hepatosplenomegaly, no distention.  Skin: Intact without lesions or rashes.  Neurologic: Alert and oriented x 3.  Psych: Normal affect. Extremities: No clubbing or cyanosis.  HEENT: Normal.   ASSESSMENT & PLAN: 1. CAD: Anterior STEMI (2/24), suspect late presentation given several days of stuttering symptoms. Cath showed 90% left main, occluded LAD, 70% pLCx, 60% pRCA.  She had DES to left main and  PTCA extensively in the LAD with diffuse 70% residual proximal LAD stenosis.  No further intervention planned on diffuse LAD disease.  No chest pain.   - Continue ASA 81.  - Needs to restart ticagrelor 90 mg bid today.  - Restart atorvastatin 80 mg daily. Check lipids in 2 months or so.  - Need to refer to cardiac rehab, she is willing to do it.  2. Chronic systolic heart failure:  Ischemic cardiomyopathy.  Echo in 2/24 with EF 20-25%, no LV thrombus, normal RV, IVC normal, trivial MR. Required IABP and inotropes  during admission. Repeat echo in 5/24 with EF up to 40-45%, normal RV.  NYHA class I-II symptoms.  No significant volume overload. With soft BP today, will not uptitrate meds.  - She does not need Lasix. - Continue Coreg 6.25 mg bid - Continue Farxiga 10 mg daily. BMET and BNP today.  - Continue Entresto 24/26 mg bid - Stop digoxin with improved EF.  - Continue spironolactone 25 mg daily. - EF out of ICD range now.  3. HTN: BP now low.  4. Smoking: She has cut back on smoking.  - I will give her a Chantix prescription to help her quit.  5. She has difficulty with managing her medications but again refuses paramedicine.    Follow up in 2 months with APP.   Marca Ancona 07/01/2022

## 2022-07-02 ENCOUNTER — Other Ambulatory Visit: Payer: Self-pay

## 2022-07-02 ENCOUNTER — Other Ambulatory Visit (HOSPITAL_BASED_OUTPATIENT_CLINIC_OR_DEPARTMENT_OTHER): Payer: Self-pay

## 2022-07-02 ENCOUNTER — Other Ambulatory Visit (HOSPITAL_COMMUNITY): Payer: Self-pay

## 2022-07-07 ENCOUNTER — Other Ambulatory Visit: Payer: Self-pay

## 2022-07-07 DIAGNOSIS — I5022 Chronic systolic (congestive) heart failure: Secondary | ICD-10-CM

## 2022-07-07 MED ORDER — TICAGRELOR 90 MG PO TABS
90.0000 mg | ORAL_TABLET | Freq: Two times a day (BID) | ORAL | 0 refills | Status: DC
Start: 2022-07-07 — End: 2022-10-19

## 2022-07-07 MED ORDER — SACUBITRIL-VALSARTAN 24-26 MG PO TABS
1.0000 | ORAL_TABLET | Freq: Two times a day (BID) | ORAL | 3 refills | Status: DC
Start: 2022-07-07 — End: 2022-10-08

## 2022-07-07 MED ORDER — ATORVASTATIN CALCIUM 80 MG PO TABS
80.0000 mg | ORAL_TABLET | Freq: Every day | ORAL | 0 refills | Status: DC
Start: 2022-07-07 — End: 2022-10-19

## 2022-07-07 MED ORDER — PANTOPRAZOLE SODIUM 40 MG PO TBEC
40.0000 mg | DELAYED_RELEASE_TABLET | Freq: Every day | ORAL | 5 refills | Status: DC
Start: 2022-07-07 — End: 2022-09-16

## 2022-07-07 MED ORDER — VARENICLINE TARTRATE 0.5 MG PO TABS
ORAL_TABLET | ORAL | 0 refills | Status: DC
Start: 2022-07-07 — End: 2022-10-08

## 2022-07-07 MED ORDER — SPIRONOLACTONE 25 MG PO TABS
25.0000 mg | ORAL_TABLET | Freq: Every day | ORAL | 3 refills | Status: DC
Start: 2022-07-07 — End: 2023-05-06

## 2022-07-07 MED ORDER — DAPAGLIFLOZIN PROPANEDIOL 10 MG PO TABS
10.0000 mg | ORAL_TABLET | Freq: Every day | ORAL | 3 refills | Status: DC
Start: 2022-07-07 — End: 2023-03-12

## 2022-07-07 MED ORDER — FUROSEMIDE 20 MG PO TABS
20.0000 mg | ORAL_TABLET | ORAL | 11 refills | Status: DC | PRN
Start: 2022-07-07 — End: 2023-03-12

## 2022-07-07 MED ORDER — CARVEDILOL 6.25 MG PO TABS
6.2500 mg | ORAL_TABLET | Freq: Two times a day (BID) | ORAL | 3 refills | Status: DC
Start: 2022-07-07 — End: 2023-05-06

## 2022-07-07 MED ORDER — POTASSIUM CHLORIDE CRYS ER 20 MEQ PO TBCR
20.0000 meq | EXTENDED_RELEASE_TABLET | Freq: Every day | ORAL | 3 refills | Status: DC
Start: 2022-07-07 — End: 2023-03-29

## 2022-07-07 NOTE — Telephone Encounter (Signed)
[facility] faxed a medication refill request for this patient. Medications were refilled.

## 2022-07-13 ENCOUNTER — Encounter (HOSPITAL_COMMUNITY): Payer: Self-pay

## 2022-07-13 ENCOUNTER — Telehealth (HOSPITAL_COMMUNITY): Payer: Self-pay

## 2022-07-13 NOTE — Telephone Encounter (Signed)
Attempted to call patient in regards to Cardiac Rehab - LM on VM Mailed letter 

## 2022-07-28 ENCOUNTER — Encounter (INDEPENDENT_AMBULATORY_CARE_PROVIDER_SITE_OTHER): Payer: Self-pay | Admitting: Primary Care

## 2022-07-28 ENCOUNTER — Ambulatory Visit (INDEPENDENT_AMBULATORY_CARE_PROVIDER_SITE_OTHER): Payer: 59 | Admitting: Primary Care

## 2022-07-28 VITALS — BP 106/68 | HR 89 | Resp 16 | Wt 198.2 lb

## 2022-07-28 DIAGNOSIS — I2102 ST elevation (STEMI) myocardial infarction involving left anterior descending coronary artery: Secondary | ICD-10-CM | POA: Diagnosis not present

## 2022-07-28 DIAGNOSIS — E6609 Other obesity due to excess calories: Secondary | ICD-10-CM

## 2022-07-28 DIAGNOSIS — Z6835 Body mass index (BMI) 35.0-35.9, adult: Secondary | ICD-10-CM | POA: Diagnosis not present

## 2022-07-28 DIAGNOSIS — R5382 Chronic fatigue, unspecified: Secondary | ICD-10-CM | POA: Diagnosis not present

## 2022-07-28 NOTE — Progress Notes (Signed)
[facility]  Kyliah Schendel, is a 72 y.o. female  [id number]  [id number]  DOB - [birth year]  Chief Complaint  Patient presents with   Hypertension       Subjective:   Ms.Tesla Nameth is a 72 y.o. female here today for a follow up visit. She is managed by cardiology for CHF. Patient has No headache, No chest pain, No abdominal pain - No Nausea, No new weakness tingling or numbness, No Cough - shortness of breath Her only concern is inability to not work requesting a Handicap plaque.  No problems updated.  No Known Allergies  No past medical history on file.  Current Outpatient Medications on File Prior to Visit  Medication Sig Dispense Refill   aspirin EC 81 MG tablet Take 1 tablet (81 mg total) by mouth daily. Swallow whole. 90 tablet 3   atorvastatin (LIPITOR) 80 MG tablet Take 1 tablet (80 mg total) by mouth daily. 90 tablet 0   carvedilol (COREG) 6.25 MG tablet Take 1 tablet (6.25 mg total) by mouth 2 (two) times daily. 180 tablet 3   dapagliflozin propanediol (FARXIGA) 10 MG TABS tablet Take 1 tablet (10 mg total) by mouth daily. 90 tablet 3   furosemide (LASIX) 20 MG tablet Take 1 tablet (20 mg total) by mouth as needed. For 3lb weight gain overnight or 5lb weight gain in one week 30 tablet 11   nitroGLYCERIN (NITROSTAT) 0.4 MG SL tablet Place 1 tablet (0.4 mg total) under the tongue every 5 (five) minutes x 3 doses as needed for chest pain. 25 tablet 12   pantoprazole (PROTONIX) 40 MG tablet Take 1 tablet (40 mg total) by mouth daily. 30 tablet 5   potassium chloride SA (KLOR-CON M) 20 MEQ tablet Take 1 tablet (20 mEq total) by mouth daily. 90 tablet 3   sacubitril-valsartan (ENTRESTO) 24-26 MG Take 1 tablet by mouth 2 (two) times daily. 180 tablet 3   spironolactone (ALDACTONE) 25 MG tablet Take 1 tablet (25 mg total) by mouth daily. 90 tablet 3   ticagrelor (BRILINTA) 90 MG TABS tablet Take 1 tablet (90 mg total) by mouth 2 (two) times daily.  180 tablet 0   varenicline (CHANTIX) 0.5 MG tablet DAYS 1-3: Take 1 tablet by mouth daily ; DAY 4-7, Take 1 tablet twice daily, Starting DAY 8: Take 2 tablets twice daily. 53 tablet 0   No current facility-administered medications on file prior to visit.    Objective:   Vitals:   07/28/22 1422 07/28/22 1424  BP: (Abnormal) 131/100 106/68  Pulse: 89   Resp: 16   SpO2: 99%   Weight: 198 lb 3.2 oz (89.9 kg)     Comprehensive ROS Pertinent positive and negative noted in HPI   Exam General appearance : Awake, alert, not in any distress. Speech Clear. Not toxic looking HEENT: Atraumatic and Normocephalic, pupils equally reactive to light and accomodation Neck: Supple, no JVD. No cervical lymphadenopathy.  Chest: Good air entry bilaterally, no added sounds  CVS: S1 S2 regular, no murmurs.  Abdomen: Bowel sounds present, Non tender and not distended with no gaurding, rigidity or rebound. Extremities: B/L Lower Ext shows no edema, both legs are warm to touch Neurology: Awake alert, and oriented X 3, CN II-XII intact, Non focal Skin: No Rash  Data Review Lab Results  Component Value Date   HGBA1C 5.2 03/21/2022    Assessment & Plan  Kahmiya was seen today for hypertension.  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 She states hard to walk room to room in home. Handicap plaque done  Class 2 obesity due to excess calories without serious comorbidity with body mass index (BMI) of 35.0 to 35.9 in adult  Obesity is 30-39 indicating an excess in caloric intake or underlining conditions. This may lead to other co-morbidities. Educated on lifestyle modifications of diet and exercise which may reduce obesity.   Patient have been counseled extensively about nutrition and exercise. Other issues discussed during this visit include: low cholesterol diet,  weight control and daily exercise, foot care, annual eye examinations at Ophthalmology, importance of adherence with medications and regular follow-up. We also discussed long term complications of uncontrolled diabetes and hypertension.   No follow-ups on file.  The patient was given clear instructions to go to ER or return to medical center if symptoms don't improve, worsen or new problems develop. The patient verbalized understanding. The patient was told to call to get lab results if they haven't heard anything in the next week.   This note has been created with Education officer, environmental. Any transcriptional errors are unintentional.   Grayce Sessions, NP 07/28/2022, 2:35 PM

## 2022-08-10 ENCOUNTER — Ambulatory Visit (INDEPENDENT_AMBULATORY_CARE_PROVIDER_SITE_OTHER): Payer: 59

## 2022-08-10 ENCOUNTER — Encounter (INDEPENDENT_AMBULATORY_CARE_PROVIDER_SITE_OTHER): Payer: Self-pay

## 2022-08-10 VITALS — Ht 63.0 in | Wt 198.0 lb

## 2022-08-10 DIAGNOSIS — Z0000 Encounter for general adult medical examination without abnormal findings: Secondary | ICD-10-CM

## 2022-08-10 NOTE — Progress Notes (Signed)
Subjective:   Rebecca Esparza is a 72 y.o. female who presents for an Initial Medicare Annual Wellness Visit.  Visit Complete: Virtual  I connected with  [name] on 08/10/22 by a audio enabled telemedicine application and verified that I am speaking with the correct person using two identifiers.  Patient Location: Home  Provider Location: Home Office  I discussed the limitations of evaluation and management by telemedicine. The patient expressed understanding and agreed to proceed.  Patient Medicare AWV questionnaire was completed by the patient on n/a; I have confirmed that all information answered by patient is correct and no changes since this date.  Review of Systems     Cardiac Risk Factors include: advanced age (>36men, >56 women);dyslipidemia;hypertension;obesity (BMI >30kg/m2);sedentary lifestyle;smoking/ tobacco exposure;Other (see comment), Risk factor comments: STEMI in Feb 2024     Objective:    Today's Vitals   08/10/22 0919  Weight: 198 lb (89.8 kg)  Height: 5\' 3"  (1.6 m)   Body mass index is 35.07 kg/m.     08/10/2022    9:18 AM 03/26/2022    4:40 PM 03/20/2022    7:37 PM  Advanced Directives  Does Patient Have a Medical Advance Directive? No No No  Would patient like information on creating a medical advance directive? No - Patient declined No - Patient declined     Current Medications (verified) Outpatient Encounter Medications as of 08/10/2022  Medication Sig   atorvastatin (LIPITOR) 80 MG tablet Take 1 tablet (80 mg total) by mouth daily.   carvedilol (COREG) 6.25 MG tablet Take 1 tablet (6.25 mg total) by mouth 2 (two) times daily.   dapagliflozin propanediol (FARXIGA) 10 MG TABS tablet Take 1 tablet (10 mg total) by mouth daily.   furosemide (LASIX) 20 MG tablet Take 1 tablet (20 mg total) by mouth as needed. For 3lb weight gain overnight or 5lb weight gain in one week   nitroGLYCERIN (NITROSTAT) 0.4 MG SL tablet Place 1 tablet (0.4 mg total)  under the tongue every 5 (five) minutes x 3 doses as needed for chest pain.   pantoprazole (PROTONIX) 40 MG tablet Take 1 tablet (40 mg total) by mouth daily.   potassium chloride SA (KLOR-CON M) 20 MEQ tablet Take 1 tablet (20 mEq total) by mouth daily.   sacubitril-valsartan (ENTRESTO) 24-26 MG Take 1 tablet by mouth 2 (two) times daily.   spironolactone (ALDACTONE) 25 MG tablet Take 1 tablet (25 mg total) by mouth daily.   ticagrelor (BRILINTA) 90 MG TABS tablet Take 1 tablet (90 mg total) by mouth 2 (two) times daily.   varenicline (CHANTIX) 0.5 MG tablet DAYS 1-3: Take 1 tablet by mouth daily ; DAY 4-7, Take 1 tablet twice daily, Starting DAY 8: Take 2 tablets twice daily.   aspirin EC 81 MG tablet Take 1 tablet (81 mg total) by mouth daily. Swallow whole. (Patient not taking: Reported on 08/10/2022)   No facility-administered encounter medications on file as of 08/10/2022.    Allergies (verified) Patient has no known allergies.   History: History reviewed. No pertinent past medical history. Past Surgical History:  Procedure Laterality Date   ABDOMINAL HYSTERECTOMY  1973   CORONARY/GRAFT ACUTE MI REVASCULARIZATION N/A 03/20/2022   Procedure: Coronary/Graft Acute MI Revascularization;  Surgeon: Tonny Bollman, MD;  Location: [facility] INVASIVE CV LAB;  Service: Cardiovascular;  Laterality: N/A;   IABP INSERTION N/A 03/20/2022   Procedure: IABP Insertion;  Surgeon: Tonny Bollman, MD;  Location: [facility] INVASIVE CV LAB;  Service: Cardiovascular;  Laterality: N/A;   RIGHT/LEFT HEART CATH AND CORONARY ANGIOGRAPHY N/A 03/20/2022   Procedure: RIGHT/LEFT HEART CATH AND CORONARY ANGIOGRAPHY;  Surgeon: Tonny Bollman, MD;  Location: [facility] INVASIVE CV LAB;  Service: Cardiovascular;  Laterality: N/A;   History reviewed. No pertinent family history. Social History   Socioeconomic History   Marital status: Single    Spouse name: Not on file   Number of children: Not on file   Years of education: Not on file    Highest education level: Not on file  Occupational History   Not on file  Tobacco Use   Smoking status: Every Day   Smokeless tobacco: Never   Tobacco comments:    5-7 cigs/day  Vaping Use   Vaping Use: Never used  Substance and Sexual Activity   Alcohol use: Not Currently   Drug use: Not on file   Sexual activity: Not on file  Other Topics Concern   Not on file  Social History Narrative   Not on file   Social Determinants of Health   Financial Resource Strain: Low Risk  (03/26/2022)   Overall Financial Resource Strain (CARDIA)    Difficulty of Paying Living Expenses: Not very hard  Food Insecurity: No Food Insecurity (03/26/2022)   Hunger Vital Sign    Worried About Running Out of Food in the Last Year: Never true    Ran Out of Food in the Last Year: Never true  Transportation Needs: Unmet Transportation Needs (03/26/2022)   PRAPARE - Transportation    Lack of Transportation (Medical): Yes    Lack of Transportation (Non-Medical): Yes  Physical Activity: Sufficiently Active (08/10/2022)   Exercise Vital Sign    Days of Exercise per Week: 7 days    Minutes of Exercise per Session: 30 min  Stress: Not on file  Social Connections: Not on file    Tobacco Counseling Ready to quit: Yes Counseling given: Yes Tobacco comments: 5-7 cigs/day   Clinical Intake:  Pre-visit preparation completed: Yes  Pain : No/denies pain     BMI - recorded: 35.07 Nutritional Status: BMI > 30  Obese Nutritional Risks: None Diabetes: No  How often do you need to have someone help you when you read instructions, pamphlets, or other written materials from your doctor or pharmacy?: 1 - Never  Interpreter Needed?: No  Information entered by :: Abby Effa Yarrow, CMA   Activities of Daily Living    08/10/2022    9:43 AM 03/26/2022    4:40 PM  In your present state of health, do you have any difficulty performing the following activities:  Hearing? 0 0  Vision? 0 0  Difficulty concentrating  or making decisions? 0 0  Walking or climbing stairs? 0 0  Dressing or bathing? 0 0  Doing errands, shopping? 0 0  Preparing Food and eating ? N   Using the Toilet? N   In the past six months, have you accidently leaked urine? N   Do you have problems with loss of bowel control? N   Managing your Medications? N   Managing your Finances? N   Housekeeping or managing your Housekeeping? N     Patient Care Team: Grayce Sessions, NP as PCP - General (Internal Medicine) Laurey Morale, MD as Consulting Physician (Cardiology)  Indicate any recent Medical Services you may have received from other than [facility] providers in the past year (date may be approximate).     Assessment:   This is a routine wellness examination for [place].  Hearing/Vision screen Hearing Screening - Comments:: Patient denies any hearing difficulties.    Dietary issues and exercise activities discussed:     Goals Addressed               This Visit's Progress     Patient Stated (pt-stated)        Patient's goal is to get her heart rate back to within a normal level. Current HR is in the 40's        Depression Screen    08/10/2022    9:43 AM 07/28/2022    2:19 PM 04/27/2022    1:57 PM  PHQ 2/9 Scores  PHQ - 2 Score 0 0   Exception Documentation   Patient refusal    Fall Risk    08/10/2022    9:42 AM 07/28/2022    2:19 PM 04/27/2022    1:57 PM  Fall Risk   Falls in the past year? 0 0 0  Number falls in past yr: 0 0 0  Injury with Fall? 0 0 0  Risk for fall due to : No Fall Risks No Fall Risks No Fall Risks  Follow up Falls prevention discussed      MEDICARE RISK AT HOME:  Medicare Risk at Home - 08/10/22 0929     Any stairs in or around the home? No    If so, are there any without handrails? No    Home free of loose throw rugs in walkways, pet beds, electrical cords, etc? Yes    Adequate lighting in your home to reduce risk of falls? Yes    Life alert? No    Use of a cane, walker or  w/c? No    Grab bars in the bathroom? Yes    Shower chair or bench in shower? Yes    Elevated toilet seat or a handicapped toilet? No             TIMED UP AND GO:  Was the test performed? No    Cognitive Function:        08/10/2022    9:29 AM  6CIT Screen  What Year? 0 points  What month? 0 points  What time? 0 points  Count back from 20 0 points  Months in reverse 0 points  Repeat phrase 0 points  Total Score 0 points    Immunizations  There is no immunization history on file for this patient.  TDAP status: Due, Education has been provided regarding the importance of this vaccine. Advised may receive this vaccine at local pharmacy or Health Dept. Aware to provide a copy of the vaccination record if obtained from local pharmacy or Health Dept. Verbalized acceptance and understanding.  Flu Vaccine status: Up to date  Pneumococcal vaccine status: Declined,  Education has been provided regarding the importance of this vaccine but patient still declined. Advised may receive this vaccine at local pharmacy or Health Dept. Aware to provide a copy of the vaccination record if obtained from local pharmacy or Health Dept. Verbalized acceptance and understanding.   Covid-19 vaccine status: Declined, Education has been provided regarding the importance of this vaccine but patient still declined. Advised may receive this vaccine at local pharmacy or Health Dept.or vaccine clinic. Aware to provide a copy of the vaccination record if obtained from local pharmacy or Health Dept. Verbalized acceptance and understanding.  Qualifies for Shingles Vaccine? Yes   Zostavax completed No   Shingrix Completed?: No.    Education has been provided  regarding the importance of this vaccine. Patient has been advised to call insurance company to determine out of pocket expense if they have not yet received this vaccine. Advised may also receive vaccine at local pharmacy or Health Dept. Verbalized  acceptance and understanding.  Screening Tests Health Maintenance  Topic Date Due   Medicare Annual Wellness (AWV)  Never done   DEXA SCAN  Never done   COVID-19 Vaccine (1) 08/13/2022 (Originally 05/14/1951)   Zoster Vaccines- Shingrix (1 of 2) 10/28/2022 (Originally 11/13/2001)   Pneumonia Vaccine 15+ Years old (1 of 2 - PCV) 07/28/2023 (Originally 11/13/1956)   MAMMOGRAM  07/28/2023 (Originally 11/13/2001)   Colonoscopy  07/28/2023 (Originally 11/13/1996)   INFLUENZA VACCINE  09/10/2022   Hepatitis C Screening  Completed   HPV VACCINES  Aged Out   DTaP/Tdap/Td  Discontinued    Health Maintenance  Health Maintenance Due  Topic Date Due   Medicare Annual Wellness (AWV)  Never done   DEXA SCAN  Never done    Colorectal cancer screening: No longer required. Patient declines referral  Mammogram: Patient declines referral  Bone Density status:  Patient declines   Lung Cancer Screening: (Low Dose CT Chest recommended if Age 32-80 years, 20 pack-year currently smoking OR have quit w/in 15years.) does not qualify.   Lung Cancer Screening Referral: n/a   Additional Screening:  Hepatitis C Screening: does not qualify; Completed 04/27/22  Vision Screening: Recommended annual ophthalmology exams for early detection of glaucoma and other disorders of the eye. Is the patient up to date with their annual eye exam?  No  Who is the provider or what is the name of the office in which the patient attends annual eye exams? Patient declines If pt is not established with a provider, would they like to be referred to a provider to establish care? No .   Dental Screening: Recommended annual dental exams for proper oral hygiene  Diabetic Foot Exam: n/a  Community Resource Referral / Chronic Care Management: CRR required this visit?  No   CCM required this visit?  No     Plan:     I have personally reviewed and noted the following in the patient's chart:   Medical and social  history Use of alcohol, tobacco or illicit drugs  Current medications and supplements including opioid prescriptions. Patient is not currently taking opioid prescriptions. Functional ability and status Nutritional status Physical activity Advanced directives List of other physicians Hospitalizations, surgeries, and ER visits in previous 12 months Vitals Screenings to include cognitive, depression, and falls Referrals and appointments  In addition, I have reviewed and discussed with patient certain preventive protocols, quality metrics, and best practice recommendations. A written personalized care plan for preventive services as well as general preventive health recommendations were provided to patient.     Jordan Hawks Dare Spillman, CMA   08/10/2022   After Visit Summary: (Mail) Due to this being a telephonic visit, the after visit summary with patients personalized plan was offered to patient via mail   Nurse Notes: Patient refused mammogram and dexa scan

## 2022-08-10 NOTE — Patient Instructions (Signed)
Rebecca Esparza , Thank you for taking time to come for your Medicare Wellness Visit. I appreciate your ongoing commitment to your health goals. Please review the following plan we discussed and let me know if I can assist you in the future.   These are the goals we discussed:  Goals       Patient Stated (pt-stated)      Patient's goal is to get her heart rate back to within a normal level. Current HR is in the 40's         This is a list of the screening recommended for you and due dates:  Health Maintenance  Topic Date Due   Medicare Annual Wellness Visit  Never done   DEXA scan (bone density measurement)  Never done   COVID-19 Vaccine (1) 08/13/2022*   Zoster (Shingles) Vaccine (1 of 2) 10/28/2022*   Pneumonia Vaccine (1 of 2 - PCV) 07/28/2023*   Mammogram  07/28/2023*   Colon Cancer Screening  07/28/2023*   Flu Shot  09/10/2022   Hepatitis C Screening  Completed   HPV Vaccine  Aged Out   DTaP/Tdap/Td vaccine  Discontinued  *Topic was postponed. The date shown is not the original due date.    Advanced directives: Advance directive discussed with you today. Even though you declined this today, please call our office should you change your mind, and we can give you the proper paperwork for you to fill out. Advance care planning is a way to make decisions about medical care that fits your values in case you are ever unable to make these decisions for yourself.  Information on Advanced Care Planning can be found at [facility] [place] Advance Health Care Directives Advance Health Care Directives ([email or web address]    Conditions/risks identified: You declined a mammogram and bone density scan today. If you change your mind, please reach out so that we can re-order them  Aim for 30 minutes of exercise or brisk walking, 6-8 glasses of water, and 5 servings of fruits and vegetables each day.   Next appointment: VIRTUAL/TELEPHONE APPOINTMENT Follow up in one year for your annual  wellness visit  August 16, 2023 at 9:30am telephone visit.    Preventive Care 85 Years and Older, Female Preventive care refers to lifestyle choices and visits with your health care provider that can promote health and wellness. What does preventive care include? A yearly physical exam. This is also called an annual well check. Dental exams once or twice a year. Routine eye exams. Ask your health care provider how often you should have your eyes checked. Personal lifestyle choices, including: Daily care of your teeth and gums. Regular physical activity. Eating a healthy diet. Avoiding tobacco and drug use. Limiting alcohol use. Practicing safe sex. Taking low-dose aspirin every day. Taking vitamin and mineral supplements as recommended by your health care provider. What happens during an annual well check? The services and screenings done by your health care provider during your annual well check will depend on your age, overall health, lifestyle risk factors, and family history of disease. Counseling  Your health care provider may ask you questions about your: Alcohol use. Tobacco use. Drug use. Emotional well-being. Home and relationship well-being. Sexual activity. Eating habits. History of falls. Memory and ability to understand (cognition). Work and work Astronomer. Reproductive health. Screening  You may have the following tests or measurements: Height, weight, and BMI. Blood pressure. Lipid and cholesterol levels. These may be checked every 5 years, or  more frequently if you are over 58 years old. Skin check. Lung cancer screening. You may have this screening every year starting at age 51 if you have a 30-pack-year history of smoking and currently smoke or have quit within the past 15 years. Fecal occult blood test (FOBT) of the stool. You may have this test every year starting at age 67. Flexible sigmoidoscopy or colonoscopy. You may have a sigmoidoscopy every 5 years  or a colonoscopy every 10 years starting at age 2. Hepatitis C blood test. Hepatitis B blood test. Sexually transmitted disease (STD) testing. Diabetes screening. This is done by checking your blood sugar (glucose) after you have not eaten for a while (fasting). You may have this done every 1-3 years. Bone density scan. This is done to screen for osteoporosis. You may have this done starting at age 40. Mammogram. This may be done every 1-2 years. Talk to your health care provider about how often you should have regular mammograms. Talk with your health care provider about your test results, treatment options, and if necessary, the need for more tests. Vaccines  Your health care provider may recommend certain vaccines, such as: Influenza vaccine. This is recommended every year. Tetanus, diphtheria, and acellular pertussis (Tdap, Td) vaccine. You may need a Td booster every 10 years. Zoster vaccine. You may need this after age 36. Pneumococcal 13-valent conjugate (PCV13) vaccine. One dose is recommended after age 53. Pneumococcal polysaccharide (PPSV23) vaccine. One dose is recommended after age 67. Talk to your health care provider about which screenings and vaccines you need and how often you need them. This information is not intended to replace advice given to you by your health care provider. Make sure you discuss any questions you have with your health care provider. Document Released: 02/22/2015 Document Revised: 10/16/2015 Document Reviewed: 11/27/2014 Elsevier Interactive Patient Education  2017 [employer].  Fall Prevention in the Home Falls can cause injuries. They can happen to people of all ages. There are many things you can do to make your home safe and to help prevent falls. What can I do on the outside of my home? Regularly fix the edges of walkways and driveways and fix any cracks. Remove anything that might make you trip as you walk through a door, such as a raised step or  threshold. Trim any bushes or trees on the path to your home. Use bright outdoor lighting. Clear any walking paths of anything that might make someone trip, such as rocks or tools. Regularly check to see if handrails are loose or broken. Make sure that both sides of any steps have handrails. Any raised decks and porches should have guardrails on the edges. Have any leaves, snow, or ice cleared regularly. Use sand or salt on walking paths during winter. Clean up any spills in your garage right away. This includes oil or grease spills. What can I do in the bathroom? Use night lights. Install grab bars by the toilet and in the tub and shower. Do not use towel bars as grab bars. Use non-skid mats or decals in the tub or shower. If you need to sit down in the shower, use a plastic, non-slip stool. Keep the floor dry. Clean up any water that spills on the floor as soon as it happens. Remove soap buildup in the tub or shower regularly. Attach bath mats securely with double-sided non-slip rug tape. Do not have throw rugs and other things on the floor that can make you trip. What  can I do in the bedroom? Use night lights. Make sure that you have a light by your bed that is easy to reach. Do not use any sheets or blankets that are too big for your bed. They should not hang down onto the floor. Have a firm chair that has side arms. You can use this for support while you get dressed. Do not have throw rugs and other things on the floor that can make you trip. What can I do in the kitchen? Clean up any spills right away. Avoid walking on wet floors. Keep items that you use a lot in easy-to-reach places. If you need to reach something above you, use a strong step stool that has a grab bar. Keep electrical cords out of the way. Do not use floor polish or wax that makes floors slippery. If you must use wax, use non-skid floor wax. Do not have throw rugs and other things on the floor that can make you  trip. What can I do with my stairs? Do not leave any items on the stairs. Make sure that there are handrails on both sides of the stairs and use them. Fix handrails that are broken or loose. Make sure that handrails are as long as the stairways. Check any carpeting to make sure that it is firmly attached to the stairs. Fix any carpet that is loose or worn. Avoid having throw rugs at the top or bottom of the stairs. If you do have throw rugs, attach them to the floor with carpet tape. Make sure that you have a light switch at the top of the stairs and the bottom of the stairs. If you do not have them, ask someone to add them for you. What else can I do to help prevent falls? Wear shoes that: Do not have high heels. Have rubber bottoms. Are comfortable and fit you well. Are closed at the toe. Do not wear sandals. If you use a stepladder: Make sure that it is fully opened. Do not climb a closed stepladder. Make sure that both sides of the stepladder are locked into place. Ask someone to hold it for you, if possible. Clearly mark and make sure that you can see: Any grab bars or handrails. First and last steps. Where the edge of each step is. Use tools that help you move around (mobility aids) if they are needed. These include: Canes. Walkers. Scooters. Crutches. Turn on the lights when you go into a dark area. Replace any light bulbs as soon as they burn out. Set up your furniture so you have a clear path. Avoid moving your furniture around. If any of your floors are uneven, fix them. If there are any pets around you, be aware of where they are. Review your medicines with your doctor. Some medicines can make you feel dizzy. This can increase your chance of falling. Ask your doctor what other things that you can do to help prevent falls. This information is not intended to replace advice given to you by your health care provider. Make sure you discuss any questions you have with your  health care provider. Document Released: 11/22/2008 Document Revised: 07/04/2015 Document Reviewed: 03/02/2014 Elsevier Interactive Patient Education  2017 [employer].

## 2022-08-27 ENCOUNTER — Encounter (HOSPITAL_COMMUNITY): Payer: 59

## 2022-09-15 ENCOUNTER — Other Ambulatory Visit: Payer: Self-pay | Admitting: Cardiology

## 2022-09-15 DIAGNOSIS — I5022 Chronic systolic (congestive) heart failure: Secondary | ICD-10-CM

## 2022-09-16 ENCOUNTER — Other Ambulatory Visit (INDEPENDENT_AMBULATORY_CARE_PROVIDER_SITE_OTHER): Payer: 59 | Admitting: Primary Care

## 2022-09-16 DIAGNOSIS — I5022 Chronic systolic (congestive) heart failure: Secondary | ICD-10-CM

## 2022-09-16 NOTE — Telephone Encounter (Addendum)
[facility] called and spoke to [place], Pensions consultant about the refill(s) pantoprazole requested. Advised it was sent on 07/07/22 #30/5 refill(s) by a different provider. She says it was received and it was shipped out on 07/23/22 #100 and she has a quantity of 80 remaining not to be refilled 09/30/22. She will need to call customer service to let them know it was not received and they will look into a resend. Patient called and she says she did not receive it. She looked through all the bags from [facility] and found one that was filled before 07/07/22 for 30 pills and she says she still has about 25 pills in another bottle. She said the doctor told her to call her primary to have this filled. I advised I will send this request to [facility] to refill to [facility] so it will be on file when it's due for another refill. She verbalized understanding. She asked is all of her medications listed filled by [facility]. I advised all on her chart are being refilled by cardiology because they are heart or BP medications and that's who is managing at this time, but if they decide that [facility] will need to take over, to call and let us know. She asked me to make sure all her medications go to [facility] and that's the only pharmacy on her chart, I advised the list has been updated to only have [facility].

## 2022-09-16 NOTE — Telephone Encounter (Addendum)
Medication Refill - Medication:  pantoprazole (PROTONIX) 40 MG tablet  Not completely out  (Also patient is requesting to update any refills, but did not state any specific meds.)  Has the patient contacted their pharmacy? Yes.  Pharmacy told patient medication had no more reflls.  Patient also contacted her heart & vascular provider and they advised her to contact her PCP; they told her that her PCP has to refill this for her.    Preferred Pharmacy (with phone number or street name):  [facility] - [place], [place] - [address] Phone: [phone]  Fax: [phone]     Has the patient been seen for an appointment in the last year OR does the patient have an upcoming appointment? Yes.  Last appt with PCP was 6.18.24     Patient is also requesting a call back about this as she received a message from pharmacy about not refilling this medication, she stated the pharmacy said the provider did not approve this medication and closed her order

## 2022-09-17 NOTE — Telephone Encounter (Signed)
Requested medication (s) are due for refill today: routing for review  Requested medication (s) are on the active medication list: yes  Last refill:  07/07/22  Future visit scheduled: no  Notes to clinic:  Unable to refill per protocol, last refill by another provider.      Requested Prescriptions  Pending Prescriptions Disp Refills   pantoprazole (PROTONIX) 40 MG tablet 30 tablet 5    Sig: Take 1 tablet (40 mg total) by mouth daily.     Gastroenterology: Proton Pump Inhibitors Passed - 09/16/2022  1:42 PM      Passed - Valid encounter within last 12 months    Recent Outpatient Visits           1 month ago Chronic fatigue   [facility] Grayce Sessions, NP   4 months ago Encounter to establish care   [place] [facility] Grayce Sessions, NP

## 2022-09-18 ENCOUNTER — Encounter (HOSPITAL_COMMUNITY): Payer: 59

## 2022-09-18 MED ORDER — PANTOPRAZOLE SODIUM 40 MG PO TBEC
40.0000 mg | DELAYED_RELEASE_TABLET | Freq: Every day | ORAL | 5 refills | Status: DC
Start: 2022-09-18 — End: 2022-10-08

## 2022-10-08 ENCOUNTER — Ambulatory Visit (HOSPITAL_COMMUNITY)
Admission: RE | Admit: 2022-10-08 | Discharge: 2022-10-08 | Disposition: A | Discharge status: Home / Self Care | Payer: 59 | Source: Ambulatory Visit | Attending: Cardiology | Admitting: Cardiology

## 2022-10-08 ENCOUNTER — Encounter (HOSPITAL_COMMUNITY): Payer: Self-pay

## 2022-10-08 VITALS — BP 118/80 | HR 89 | Wt 200.2 lb

## 2022-10-08 DIAGNOSIS — I110 Hypertensive heart disease with heart failure: Secondary | ICD-10-CM | POA: Insufficient documentation

## 2022-10-08 DIAGNOSIS — I2510 Atherosclerotic heart disease of native coronary artery without angina pectoris: Secondary | ICD-10-CM | POA: Diagnosis not present

## 2022-10-08 DIAGNOSIS — I5022 Chronic systolic (congestive) heart failure: Secondary | ICD-10-CM

## 2022-10-08 DIAGNOSIS — Z91148 Patient's other noncompliance with medication regimen for other reason: Secondary | ICD-10-CM | POA: Insufficient documentation

## 2022-10-08 DIAGNOSIS — M25512 Pain in left shoulder: Secondary | ICD-10-CM | POA: Diagnosis not present

## 2022-10-08 DIAGNOSIS — G8929 Other chronic pain: Secondary | ICD-10-CM | POA: Diagnosis not present

## 2022-10-08 DIAGNOSIS — R9431 Abnormal electrocardiogram [ECG] [EKG]: Secondary | ICD-10-CM | POA: Insufficient documentation

## 2022-10-08 DIAGNOSIS — F17210 Nicotine dependence, cigarettes, uncomplicated: Secondary | ICD-10-CM | POA: Insufficient documentation

## 2022-10-08 DIAGNOSIS — I252 Old myocardial infarction: Secondary | ICD-10-CM | POA: Diagnosis not present

## 2022-10-08 LAB — BASIC METABOLIC PANEL
Anion gap: 11 (ref 5–15)
BUN: 22 mg/dL (ref 8–23)
CO2: 23 mmol/L (ref 22–32)
Calcium: 9.4 mg/dL (ref 8.9–10.3)
Chloride: 104 mmol/L (ref 98–111)
Creatinine, Ser: 1.46 mg/dL — ABNORMAL HIGH (ref 0.44–1.00)
GFR, Estimated: 38 mL/min — ABNORMAL LOW (ref >60)
Glucose, Bld: 108 mg/dL — ABNORMAL HIGH (ref 70–99)
Potassium: 4.8 mmol/L (ref 3.5–5.1)
Sodium: 138 mmol/L (ref 135–145)

## 2022-10-08 LAB — HEPATIC FUNCTION PANEL
ALT: 19 U/L (ref 0–44)
AST: 23 U/L (ref 15–41)
Albumin: 3.6 g/dL (ref 3.5–5.0)
Alkaline Phosphatase: 74 U/L (ref 38–126)
Bilirubin, Direct: <0.1 mg/dL (ref 0.0–0.2)
Total Bilirubin: 0.7 mg/dL (ref 0.3–1.2)
Total Protein: 7 g/dL (ref 6.5–8.1)

## 2022-10-08 LAB — BRAIN NATRIURETIC PEPTIDE: B Natriuretic Peptide: 137.7 pg/mL — ABNORMAL HIGH (ref 0.0–100.0)

## 2022-10-08 LAB — LIPID PANEL
Cholesterol: 227 mg/dL — ABNORMAL HIGH (ref 0–200)
HDL: 51 mg/dL (ref >40)
LDL Cholesterol: 157 mg/dL — ABNORMAL HIGH (ref 0–99)
Total CHOL/HDL Ratio: 4.5 RATIO
Triglycerides: 95 mg/dL (ref <150)
VLDL: 19 mg/dL (ref 0–40)

## 2022-10-08 MED ORDER — ENTRESTO 49-51 MG PO TABS: 1.0000 | ORAL_TABLET | Freq: Two times a day (BID) | ORAL | 5 refills | Status: DC

## 2022-10-08 MED ORDER — ASPIRIN 81 MG PO TBEC: 81.0000 mg | DELAYED_RELEASE_TABLET | Freq: Every day | ORAL | 3 refills | Status: DC

## 2022-10-08 MED ORDER — VARENICLINE TARTRATE 0.5 MG PO TABS
ORAL_TABLET | ORAL | 0 refills | Status: DC
Start: 2022-10-08 — End: 2022-11-19

## 2022-10-08 NOTE — Patient Instructions (Signed)
EKG done today.  Labs done today. We will contact you only if your labs are abnormal.  RESTART Aspirin 81mg  (1 tablet) by mouth daily.  INCREASE Entresto to 49-51mg  (1 tablet) by mouth 2 times daily.   Your Chantix has been refilled  No other medication changes were made. Please continue all current medications as prescribed.  You have been referred to Orthopedics. They will contact you to schedule an appointment.   Your physician recommends that you schedule a follow-up appointment in: 4 weeks with our Clinic Pharmacist and in 6-8 weeks with our NP/PA Clinic here in our office.   If you have any questions or concerns before your next appointment please send [place] a message through [place] or call our office at [phone].    TO LEAVE A MESSAGE FOR THE NURSE SELECT OPTION 2, PLEASE LEAVE A MESSAGE INCLUDING: YOUR NAME DATE OF BIRTH CALL BACK NUMBER REASON FOR CALL**this is important as we prioritize the call backs  YOU WILL RECEIVE A CALL BACK THE SAME DAY AS LONG AS YOU CALL BEFORE 4:00 PM   Do the following things EVERYDAY: Weigh yourself in the morning before breakfast. Write it down and keep it in a log. Take your medicines as prescribed Eat low salt foods--Limit salt (sodium) to 2000 mg per day.  Stay as active as you can everyday Limit all fluids for the day to less than 2 liters   At the [facility], you and your health needs are our priority. As part of our continuing mission to provide you with exceptional heart care, we have created designated Provider Care Teams. These Care Teams include your primary Cardiologist (physician) and Advanced Practice Providers (APPs- Physician Assistants and Nurse Practitioners) who all work together to provide you with the care you need, when you need it.   You may see any of the following providers on your designated Care Team at your next follow up: Dr Arvilla Meres Dr Marca Ancona Dr. Marcos Eke, NP Robbie Lis, [place] [facility] [place], [place] Brynda Peon, NP Karle Plumber, PharmD   Please be sure to bring in all your medications bottles to every appointment.    Thank you for choosing [facility]

## 2022-10-08 NOTE — Progress Notes (Signed)
ADVANCED HF CLINIC NOTE  Primary Care: Gwinda Passe, NP HF Cardiologist: Dr. Shirlee Latch  HPI: 72 y.o. female with history of HTN and smoking, with a new history of CAD and systolic heart failure/iCM.  She was admitted 2/24 with anterior STEMI. She was taken to the cath lab, showing 90% left main, occluded LAD, 70% pLCx, 60% pRCA. She had DES to left main and PTCA extensively in the LAD with diffuse 70% residual proximal LAD stenosis. RHC showed significantly elevated right and left heart filling pressures, IABP was placed and patient received IV Lasix. Echo showed EF 20-25%, normal RV.  She continued diuresis with IV lasix, IABP eventually removed. GDMT titrated, she was fitted for LifeVest and discharged home, weight 213 lbs.  Repeat echo in 5/24 showed EF 40-45%, mild LVH, normal RV, IVC normal.  She took off the Lifevest.   Last OV 5/24, she denied CP. Stable NYHA class I-II symptoms.  No significant volume overload. However she had been off of several meds for CAD, including ticagrelor and atorvastatin for roughly 4 wks. Both were restarted. BP was too soft for HF GDMT titration. Digoxin was discontinued given improvement in LVEF. She had also reported ongoing tobacco use and was given Rx for Chantix. Referral placed to start cardiac rehab. She was also offered paramedicine assistance to help w/ meds but she refused.   She presents back today for f/u. Denies CP. No resting dyspnea. Does ok w/ basic ADLs but mildly SOB if she over-exerts. Reports compliance w/ all meds except ASA. She claims a provider told her to stop this but I see no record of this. EKG shows NSR 89 bpm. She complains of chronic left upper arm/shoulder pain. Present x 6 wks. Occurred after trying to move her bed and lift up a mattress. Has limited active ROM w/ forward flexion. Suspect possible rotator cuff injury.   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 CAD: Anterior STEMI 2/24. LHC with 90% left main, occluded LAD, 70% pLCx, 60% pRCA.  She had DES to left main and PTCA extensively in the LAD with diffuse 70% residual proximal LAD stenosis.  2. HTN 3. Active smoker 4. Chronic systolic CHF: Ischemic cardiomyopathy.  - RHC (2/24): RA 20, PA 49/32 (40), PCWP 33, CI from swan 2.35 - Echo (2/24): EF 20-25%, no LV thrombus, normal RV, IVC normal, trivial MR - Echo (5/24): EF 40-45%, mild LVH, normal RV, IVC normal   Current Outpatient Medications  Medication Sig Dispense Refill   atorvastatin (LIPITOR) 80 MG tablet Take 1 tablet (80 mg total) by mouth daily. 90 tablet 0   carvedilol (COREG) 6.25 MG tablet Take 1 tablet (6.25 mg total) by mouth 2 (two) times daily. 180 tablet 3   dapagliflozin propanediol (FARXIGA) 10 MG TABS tablet Take 1 tablet (10 mg total) by mouth daily. 90 tablet 3   furosemide (LASIX) 20 MG tablet Take 1 tablet (20 mg total) by mouth as needed. For 3lb weight gain overnight or 5lb weight gain in one week 30 tablet 11   nitroGLYCERIN (NITROSTAT) 0.4 MG SL tablet Place 1 tablet (0.4 mg total) under the tongue every 5 (five) minutes x 3 doses as needed for chest pain. 25 tablet 12   potassium chloride SA (KLOR-CON M) 20 MEQ tablet Take 1 tablet (20 mEq total) by mouth daily. 90 tablet 3  sacubitril-valsartan (ENTRESTO) 49-51 MG Take 1 tablet by mouth 2 (two) times daily. 60 tablet 5   spironolactone (ALDACTONE) 25 MG tablet Take 1 tablet (25 mg total) by mouth daily. 90 tablet 3   ticagrelor (BRILINTA) 90 MG TABS tablet Take 1 tablet (90 mg total) by mouth 2 (two) times daily. 180 tablet 0   aspirin EC 81 MG tablet Take 1 tablet (81 mg total) by mouth daily. Swallow whole. 90 tablet 3   varenicline (CHANTIX) 0.5 MG tablet DAYS 1-3: Take 1 tablet by mouth daily ; DAY 4-7, Take 1 tablet twice daily, Starting DAY 8: Take 2 tablets twice daily. 53 tablet 0   No  current facility-administered medications for this encounter.   No Known Allergies  Social History   Socioeconomic History   Marital status: Single    Spouse name: Not on file   Number of children: Not on file   Years of education: Not on file   Highest education level: Not on file  Occupational History   Not on file  Tobacco Use   Smoking status: Every Day   Smokeless tobacco: Never   Tobacco comments:    5-7 cigs/day  Vaping Use   Vaping status: Never Used  Substance and Sexual Activity   Alcohol use: Not Currently   Drug use: Not on file   Sexual activity: Not on file  Other Topics Concern   Not on file  Social History Narrative   Not on file   Social Determinants of Health   Financial Resource Strain: Low Risk  (03/26/2022)   Overall Financial Resource Strain (CARDIA)    Difficulty of Paying Living Expenses: Not very hard  Food Insecurity: No Food Insecurity (03/26/2022)   Hunger Vital Sign    Worried About Running Out of Food in the Last Year: Never true    Ran Out of Food in the Last Year: Never true  Transportation Needs: Unmet Transportation Needs (03/26/2022)   PRAPARE - Transportation    Lack of Transportation (Medical): Yes    Lack of Transportation (Non-Medical): Yes  Physical Activity: Sufficiently Active (08/10/2022)   Exercise Vital Sign    Days of Exercise per Week: 7 days    Minutes of Exercise per Session: 30 min  Stress: Not on file  Social Connections: Not on file  Intimate Partner Violence: Not At Risk (03/26/2022)   Humiliation, Afraid, Rape, and Kick questionnaire    Fear of Current or Ex-Partner: No    Emotionally Abused: No    Physically Abused: No    Sexually Abused: No   No family history on file.  BP 118/80   Pulse 89   Wt 90.8 kg (200 lb 3.2 oz)   SpO2 96%   BMI 35.46 kg/m   Wt Readings from Last 3 Encounters:  10/08/22 90.8 kg (200 lb 3.2 oz)  08/10/22 89.8 kg (198 lb)  07/28/22 89.9 kg (198 lb 3.2 oz)   PHYSICAL  EXAM: General:  resting comfortably. No respiratory difficulty HEENT: normal Neck: supple. no JVD. Carotids 2+ bilat; no bruits. No lymphadenopathy or thyromegaly appreciated. Cor: PMI nondisplaced. Regular rate & rhythm. No rubs, gallops or murmurs. Lungs: clear Abdomen: soft, nontender, nondistended. No hepatosplenomegaly. No bruits or masses. Good bowel sounds. Extremities: no cyanosis, clubbing, rash, edema Neuro: alert & oriented x 3, cranial nerves grossly intact. moves all 4 extremities w/o difficulty. Affect pleasant. MSK: limited AROM LT UE. Able to extend further w/ PROM   ASSESSMENT & PLAN: 1. CAD:  Anterior STEMI (2/24), suspect late presentation given several days of stuttering symptoms. Cath showed 90% left main, occluded LAD, 70% pLCx, 60% pRCA.  She had DES to left main and PTCA extensively in the LAD with diffuse 70% residual proximal LAD stenosis.  No further intervention planned on diffuse LAD disease.  stable w/o CP  - Restart ASA 81 mg daily  - Continue ticagrelor 90 mg bid  - Continue atorvastatin 80 mg daily. Check Lipid panel and HFTs today  2. Chronic systolic heart failure:  Ischemic cardiomyopathy.  Echo in 2/24 with EF 20-25%, no LV thrombus, normal RV, IVC normal, trivial MR. Required IABP and inotropes during admission. Repeat echo in 5/24 with EF up to 40-45%, normal RV.  NYHA class II symptoms.  No significant volume overload.  - She does not need Lasix. - Continue Coreg 6.25 mg bid - Continue Farxiga 10 mg daily.  - Increase Entresto to 49-51 mg bid - Off digoxin with improved EF.  - Continue spironolactone 25 mg daily. - EF out of ICD range now.  - Check BMP and BNP today  3. HTN: controlled on current regimen  - GDMT per above  4. Smoking: She has cut back on smoking.  - will refill Chantix prescription to help her quit.  5. Poor Compliance: Stressed importance of strict med compliance.  She has difficulty with managing her medications but again refuses  paramedicine.  6. Left Shoulder Pain: suspect possible rotator cuff tear - will refer to orthopedics for further evaluation. May need MRI - if confirmed, would need non surgical treatment and PT until preferably after 2/25 given need for continuous DAPT for LM stent.    Follow up w/ PharmD in 2-3 wks for further med titration. APP in 6 wks    Robbie Lis, [place]  10/08/2022

## 2022-10-09 ENCOUNTER — Telehealth (HOSPITAL_COMMUNITY): Payer: Self-pay

## 2022-10-09 DIAGNOSIS — E785 Hyperlipidemia, unspecified: Secondary | ICD-10-CM

## 2022-10-09 NOTE — Telephone Encounter (Signed)
-----   Message from [place] sent at 10/08/2022  4:35 PM EDT ----- HLD is high. Please check to see if she has been compliant w/ her atorvastatin daily. If she has not, then please refer to Lipid Clinic   All other labs stable

## 2022-10-09 NOTE — Telephone Encounter (Signed)
Called patient and she has not been taking her atorvastatin  medication regularly. In addition, Patient's  lipid clinic referral has been placed. Pt aware, agreeable, and verbalized understanding.

## 2022-10-14 ENCOUNTER — Ambulatory Visit: Payer: 59 | Admitting: Physician Assistant

## 2022-10-18 ENCOUNTER — Other Ambulatory Visit: Payer: Self-pay | Admitting: Cardiology

## 2022-10-18 DIAGNOSIS — I5022 Chronic systolic (congestive) heart failure: Secondary | ICD-10-CM

## 2022-11-09 ENCOUNTER — Inpatient Hospital Stay (HOSPITAL_COMMUNITY): Admission: RE | Admit: 2022-11-09 | Payer: 59 | Source: Ambulatory Visit

## 2022-11-19 ENCOUNTER — Ambulatory Visit (HOSPITAL_COMMUNITY)
Admission: RE | Admit: 2022-11-19 | Discharge: 2022-11-19 | Disposition: A | Discharge status: Home / Self Care | Payer: 59 | Source: Ambulatory Visit | Attending: Physician Assistant | Admitting: Physician Assistant

## 2022-11-19 ENCOUNTER — Encounter (HOSPITAL_COMMUNITY): Payer: Self-pay

## 2022-11-19 VITALS — BP 126/86 | HR 96 | Wt 201.4 lb

## 2022-11-19 DIAGNOSIS — F17200 Nicotine dependence, unspecified, uncomplicated: Secondary | ICD-10-CM | POA: Diagnosis not present

## 2022-11-19 DIAGNOSIS — Z7902 Long term (current) use of antithrombotics/antiplatelets: Secondary | ICD-10-CM | POA: Insufficient documentation

## 2022-11-19 DIAGNOSIS — Z79899 Other long term (current) drug therapy: Secondary | ICD-10-CM | POA: Insufficient documentation

## 2022-11-19 DIAGNOSIS — Z955 Presence of coronary angioplasty implant and graft: Secondary | ICD-10-CM | POA: Insufficient documentation

## 2022-11-19 DIAGNOSIS — F17210 Nicotine dependence, cigarettes, uncomplicated: Secondary | ICD-10-CM | POA: Insufficient documentation

## 2022-11-19 DIAGNOSIS — E782 Mixed hyperlipidemia: Secondary | ICD-10-CM | POA: Diagnosis not present

## 2022-11-19 DIAGNOSIS — IMO0001 Reserved for inherently not codable concepts without codable children: Secondary | ICD-10-CM

## 2022-11-19 DIAGNOSIS — I110 Hypertensive heart disease with heart failure: Secondary | ICD-10-CM | POA: Diagnosis not present

## 2022-11-19 DIAGNOSIS — E785 Hyperlipidemia, unspecified: Secondary | ICD-10-CM | POA: Diagnosis not present

## 2022-11-19 DIAGNOSIS — I10 Essential (primary) hypertension: Secondary | ICD-10-CM

## 2022-11-19 DIAGNOSIS — I252 Old myocardial infarction: Secondary | ICD-10-CM | POA: Diagnosis not present

## 2022-11-19 DIAGNOSIS — I2510 Atherosclerotic heart disease of native coronary artery without angina pectoris: Secondary | ICD-10-CM

## 2022-11-19 DIAGNOSIS — I255 Ischemic cardiomyopathy: Secondary | ICD-10-CM | POA: Diagnosis not present

## 2022-11-19 DIAGNOSIS — Z7982 Long term (current) use of aspirin: Secondary | ICD-10-CM | POA: Diagnosis not present

## 2022-11-19 DIAGNOSIS — I5022 Chronic systolic (congestive) heart failure: Secondary | ICD-10-CM | POA: Diagnosis not present

## 2022-11-19 LAB — BASIC METABOLIC PANEL
Anion gap: 13 (ref 5–15)
BUN: 12 mg/dL (ref 8–23)
CO2: 20 mmol/L — ABNORMAL LOW (ref 22–32)
Calcium: 10 mg/dL (ref 8.9–10.3)
Chloride: 107 mmol/L (ref 98–111)
Creatinine, Ser: 1.26 mg/dL — ABNORMAL HIGH (ref 0.44–1.00)
GFR, Estimated: 46 mL/min — ABNORMAL LOW (ref >60)
Glucose, Bld: 120 mg/dL — ABNORMAL HIGH (ref 70–99)
Potassium: 4.1 mmol/L (ref 3.5–5.1)
Sodium: 140 mmol/L (ref 135–145)

## 2022-11-19 MED ORDER — ROSUVASTATIN CALCIUM 20 MG PO TABS: 20.0000 mg | ORAL_TABLET | Freq: Every day | ORAL | 5 refills | Status: DC

## 2022-11-19 MED ORDER — VARENICLINE TARTRATE 0.5 MG PO TABS: ORAL_TABLET | ORAL | 0 refills | Status: DC

## 2022-11-19 MED ORDER — ENTRESTO 49-51 MG PO TABS: 1.0000 | ORAL_TABLET | Freq: Two times a day (BID) | ORAL | 5 refills | Status: DC

## 2022-11-19 NOTE — Patient Instructions (Addendum)
Medication Changes:  STOP ATORVASTATIN   START: ROSUVASTATIN 20MG  DAILY   CHANTIX SENT IN- PLEASE FOLLOW INSTRUCTIONS ON PACKAGE   Lab Work:  Labs done today, your results will be available in MyChart, we will contact you for abnormal readings.  Follow-Up in: 3 MONTHS WITH APP WITH ECHO   At the [facility], you and your health needs are our priority. We have a designated team specialized in the treatment of Heart Failure. This Care Team includes your primary Heart Failure Specialized Cardiologist (physician), Advanced Practice Providers (APPs- Physician Assistants and Nurse Practitioners), and Pharmacist who all work together to provide you with the care you need, when you need it.   You may see any of the following providers on your designated Care Team at your next follow up:  Dr. Arvilla Meres Dr. Marca Ancona Dr. Dorthula Nettles Dr. Theresia Bough Tonye Becket, NP Robbie Lis, [place] [facility] [place], [place] Brynda Peon, NP [place] Lee, NP Karle Plumber, PharmD   Please be sure to bring in all your medications bottles to every appointment.   Need to Contact us:  If you have any questions or concerns before your next appointment please send [place] a message through [place] or call our office at [phone].    TO LEAVE A MESSAGE FOR THE NURSE SELECT OPTION 2, PLEASE LEAVE A MESSAGE INCLUDING: YOUR NAME DATE OF BIRTH CALL BACK NUMBER REASON FOR CALL**this is important as we prioritize the call backs  YOU WILL RECEIVE A CALL BACK THE SAME DAY AS LONG AS YOU CALL BEFORE 4:00 PM

## 2022-11-19 NOTE — Progress Notes (Signed)
ADVANCED HF CLINIC NOTE  Primary Care: Gwinda Passe, NP HF Cardiologist: Dr. Shirlee Latch  HPI: 72 y.o. female with history of HTN and smoking, with a new history of CAD and systolic heart failure/iCM.  She was admitted 2/24 with anterior STEMI. She was taken to the cath lab, showing 90% left main, occluded LAD, 70% pLCx, 60% pRCA. She had DES to left main and PTCA extensively in the LAD with diffuse 70% residual proximal LAD stenosis. RHC showed significantly elevated right and left heart filling pressures, IABP was placed and patient received IV Lasix. Echo showed EF 20-25%, normal RV.  She continued diuresis with IV lasix, IABP eventually removed. GDMT titrated, she was fitted for LifeVest and discharged home, weight 213 lbs.  Repeat echo in 5/24 showed EF 40-45%, mild LVH, normal RV, IVC normal.    Here today for follow-up. She has been feeling well. No chest pain or shortness of breath. No orthopnea, PND or lower extremity edema. Reports restricting sodium intake. Reports taking all medications except Atorvastatin. She states it caused left shoulder pain, symptoms improved with stopping the medication. No ETOH or drug use. Smokes less than 1 ppd. Wants to try chantix again.    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 CAD: Anterior STEMI 2/24. LHC with 90% left main, occluded LAD, 70% pLCx, 60% pRCA.  She had DES to left main and PTCA extensively in the LAD with diffuse 70% residual proximal LAD stenosis.  2. HTN 3. Active smoker 4. Chronic systolic CHF: Ischemic cardiomyopathy.  - RHC (2/24): RA 20, PA 49/32 (40), PCWP 33, CI from swan 2.35 - Echo (2/24): EF 20-25%, no LV thrombus, normal RV, IVC normal, trivial MR - Echo (5/24): EF 40-45%, mild LVH, normal RV, IVC normal   Current Outpatient Medications   Medication Sig Dispense Refill   aspirin EC 81 MG tablet Take 1 tablet (81 mg total) by mouth daily. Swallow whole. 90 tablet 3   BRILINTA 90 MG TABS tablet TAKE 1 TABLET BY MOUTH TWICE  DAILY 180 tablet 3   carvedilol (COREG) 6.25 MG tablet Take 1 tablet (6.25 mg total) by mouth 2 (two) times daily. 180 tablet 3   dapagliflozin propanediol (FARXIGA) 10 MG TABS tablet Take 1 tablet (10 mg total) by mouth daily. 90 tablet 3   furosemide (LASIX) 20 MG tablet Take 1 tablet (20 mg total) by mouth as needed. For 3lb weight gain overnight or 5lb weight gain in one week 30 tablet 11   nitroGLYCERIN (NITROSTAT) 0.4 MG SL tablet Place 1 tablet (0.4 mg total) under the tongue every 5 (five) minutes x 3 doses as needed for chest pain. 25 tablet 12   potassium chloride SA (KLOR-CON M) 20 MEQ tablet Take 1 tablet (20 mEq total) by mouth daily. 90 tablet 3   rosuvastatin (CRESTOR) 20 MG tablet Take 1 tablet (20 mg total) by mouth daily. 30 tablet 5   spironolactone (ALDACTONE) 25 MG tablet Take 1 tablet (25 mg total) by mouth daily. 90 tablet 3   sacubitril-valsartan (ENTRESTO) 49-51 MG Take 1 tablet by mouth 2 (two) times daily. 60 tablet 5   varenicline (CHANTIX) 0.5 MG tablet DAYS 1-3: Take 1 tablet by mouth daily ; DAY 4-7, Take 1 tablet twice daily, Starting DAY 8: Take 2 tablets twice daily.  53 tablet 0   No current facility-administered medications for this encounter.   No Known Allergies  Social History   Socioeconomic History   Marital status: Single    Spouse name: Not on file   Number of children: Not on file   Years of education: Not on file   Highest education level: Not on file  Occupational History   Not on file  Tobacco Use   Smoking status: Every Day   Smokeless tobacco: Never   Tobacco comments:    5-7 cigs/day  Vaping Use   Vaping status: Never Used  Substance and Sexual Activity   Alcohol use: Not Currently   Drug use: Not on file   Sexual activity: Not on file  Other  Topics Concern   Not on file  Social History Narrative   Not on file   Social Determinants of Health   Financial Resource Strain: Low Risk  (03/26/2022)   Overall Financial Resource Strain (CARDIA)    Difficulty of Paying Living Expenses: Not very hard  Food Insecurity: No Food Insecurity (03/26/2022)   Hunger Vital Sign    Worried About Running Out of Food in the Last Year: Never true    Ran Out of Food in the Last Year: Never true  Transportation Needs: Unmet Transportation Needs (03/26/2022)   PRAPARE - Transportation    Lack of Transportation (Medical): Yes    Lack of Transportation (Non-Medical): Yes  Physical Activity: Sufficiently Active (08/10/2022)   Exercise Vital Sign    Days of Exercise per Week: 7 days    Minutes of Exercise per Session: 30 min  Stress: Not on file  Social Connections: Not on file  Intimate Partner Violence: Not At Risk (03/26/2022)   Humiliation, Afraid, Rape, and Kick questionnaire    Fear of Current or Ex-Partner: No    Emotionally Abused: No    Physically Abused: No    Sexually Abused: No   No family history on file.  BP 126/86   Pulse 96   Wt 91.4 kg (201 lb 6.4 oz)   SpO2 98%   BMI 35.68 kg/m   Wt Readings from Last 3 Encounters:  11/19/22 91.4 kg (201 lb 6.4 oz)  10/08/22 90.8 kg (200 lb 3.2 oz)  08/10/22 89.8 kg (198 lb)   PHYSICAL EXAM: General:  Well appearing.  HEENT: normal Neck: supple. no JVD. Carotids 2+ bilat; no bruits.  Cor: PMI nondisplaced. Regular rate & rhythm. No rubs, gallops or murmurs. Lungs: clear Abdomen: soft, nontender, nondistended.  Extremities: no cyanosis, clubbing, rash, edema Neuro: alert & oriented x 3. Affect pleasant   ASSESSMENT & PLAN: 1. CAD: Anterior STEMI (2/24), suspect late presentation given several days of stuttering symptoms. Cath showed 90% left main, occluded LAD, 70% pLCx, 60% pRCA.  She had DES to left main and PTCA extensively in the LAD with diffuse 70% residual proximal LAD  stenosis.  No further intervention planned on diffuse LAD disease.  Stable w/ no angina - Continue ASA 81 mg daily  - Continue ticagrelor 90 mg bid  - Continue atorvastatin 80 mg daily. Check Lipid panel and HFTs today  2. Chronic systolic heart failure:  Ischemic cardiomyopathy.  Echo in 2/24 with EF 20-25%, no LV thrombus, normal RV, IVC normal, trivial MR. Required IABP and inotropes during admission. Repeat echo in 5/24 with EF up to 40-45%, normal RV.  NYHA class II symptoms.  No significant volume overload.  - She does not need Lasix. - Continue Coreg  6.25 mg bid - Continue Farxiga 10 mg daily.  - Continue Entresto 49/51 mg BID - Off digoxin with improved EF.  - Continue spironolactone 25 mg daily. - EF out of ICD range now.  - Labs today 3. HTN: controlled on current regimen  - GDMT per above  4. Smoking: Smoking a little less than 1 ppd - Wants to try chantix again, refilled. 5. HLD: Last LDL 157 - Stopped atorvastatin d/t shoulder pain - Start rosuvastatin 20 mg daily, lipid panel and LFTs next visit - May need lipid clinic referral   Follow-up: 3 months with APP + echo  [facility], Gareld Obrecht N, PA-C  11/19/2022

## 2022-12-21 ENCOUNTER — Other Ambulatory Visit (INDEPENDENT_AMBULATORY_CARE_PROVIDER_SITE_OTHER): Payer: Self-pay | Admitting: Primary Care

## 2022-12-21 DIAGNOSIS — I5022 Chronic systolic (congestive) heart failure: Secondary | ICD-10-CM

## 2023-01-16 ENCOUNTER — Other Ambulatory Visit: Payer: Self-pay | Admitting: Cardiology

## 2023-02-19 ENCOUNTER — Encounter (HOSPITAL_COMMUNITY): Payer: 59

## 2023-02-19 ENCOUNTER — Ambulatory Visit (HOSPITAL_COMMUNITY): Payer: 59

## 2023-02-23 ENCOUNTER — Other Ambulatory Visit: Payer: Self-pay | Admitting: Cardiology

## 2023-03-03 ENCOUNTER — Telehealth (HOSPITAL_COMMUNITY): Payer: Self-pay | Admitting: Family Medicine

## 2023-03-07 ENCOUNTER — Other Ambulatory Visit (INDEPENDENT_AMBULATORY_CARE_PROVIDER_SITE_OTHER): Payer: Self-pay | Admitting: Primary Care

## 2023-03-07 DIAGNOSIS — I5022 Chronic systolic (congestive) heart failure: Secondary | ICD-10-CM

## 2023-03-08 NOTE — Telephone Encounter (Signed)
Unable to refill per protocol, Rx expired. Discontinued 10/08/22.  Requested Prescriptions  Pending Prescriptions Disp Refills   pantoprazole (PROTONIX) 40 MG tablet [Pharmacy Med Name: Pantoprazole Sodium 40 MG Oral Tablet Delayed Release] 80 tablet 3    Sig: TAKE 1 TABLET BY MOUTH DAILY     Gastroenterology: Proton Pump Inhibitors Passed - 03/08/2023  2:43 PM      Passed - Valid encounter within last 12 months    Recent Outpatient Visits           7 months ago Chronic fatigue   [place] [facility] Grayce Sessions, NP   10 months ago Encounter to establish care   [place] [facility] Grayce Sessions, NP

## 2023-03-11 ENCOUNTER — Other Ambulatory Visit: Payer: Self-pay | Admitting: Cardiology

## 2023-03-11 DIAGNOSIS — I5022 Chronic systolic (congestive) heart failure: Secondary | ICD-10-CM

## 2023-03-18 ENCOUNTER — Other Ambulatory Visit (HOSPITAL_COMMUNITY): Payer: Self-pay | Admitting: Physician Assistant

## 2023-03-23 ENCOUNTER — Other Ambulatory Visit (HOSPITAL_COMMUNITY): Payer: 59

## 2023-03-23 ENCOUNTER — Encounter (HOSPITAL_COMMUNITY): Payer: 59

## 2023-03-25 ENCOUNTER — Ambulatory Visit (HOSPITAL_COMMUNITY): Payer: 59

## 2023-03-25 ENCOUNTER — Encounter (HOSPITAL_COMMUNITY): Payer: 59

## 2023-03-26 ENCOUNTER — Other Ambulatory Visit: Payer: Self-pay | Admitting: Cardiology

## 2023-03-26 DIAGNOSIS — I5022 Chronic systolic (congestive) heart failure: Secondary | ICD-10-CM

## 2023-04-14 ENCOUNTER — Encounter (HOSPITAL_COMMUNITY): Payer: 59

## 2023-04-14 ENCOUNTER — Ambulatory Visit (HOSPITAL_COMMUNITY): Payer: 59

## 2023-05-05 ENCOUNTER — Other Ambulatory Visit: Payer: Self-pay | Admitting: Cardiology

## 2023-05-05 DIAGNOSIS — I5022 Chronic systolic (congestive) heart failure: Secondary | ICD-10-CM

## 2023-05-06 ENCOUNTER — Other Ambulatory Visit: Payer: Self-pay | Admitting: Cardiology

## 2023-05-06 DIAGNOSIS — I5022 Chronic systolic (congestive) heart failure: Secondary | ICD-10-CM

## 2023-05-07 ENCOUNTER — Telehealth (HOSPITAL_COMMUNITY): Payer: Self-pay

## 2023-05-07 NOTE — Telephone Encounter (Signed)
 Called to confirm/remind patient of their appointment at the [facility] on 05/10/23***.   Appointment:   [x] Confirmed  [] Left mess   [] No answer/No voice mail  [] Phone not in service  Patient reminded to bring all medications and/or complete list.  Confirmed patient has transportation. Gave directions, instructed to utilize valet parking.

## 2023-05-10 ENCOUNTER — Ambulatory Visit (HOSPITAL_BASED_OUTPATIENT_CLINIC_OR_DEPARTMENT_OTHER)
Admission: RE | Admit: 2023-05-10 | Discharge: 2023-05-10 | Disposition: A | Discharge status: Home / Self Care | Source: Ambulatory Visit | Attending: Physician Assistant | Admitting: Physician Assistant

## 2023-05-10 ENCOUNTER — Ambulatory Visit (HOSPITAL_COMMUNITY)
Admission: RE | Admit: 2023-05-10 | Discharge: 2023-05-10 | Disposition: A | Discharge status: Home / Self Care | Source: Ambulatory Visit | Attending: Physician Assistant | Admitting: Physician Assistant

## 2023-05-10 ENCOUNTER — Encounter (HOSPITAL_COMMUNITY): Payer: Self-pay

## 2023-05-10 VITALS — BP 140/98 | HR 82 | Wt 218.2 lb

## 2023-05-10 DIAGNOSIS — Z955 Presence of coronary angioplasty implant and graft: Secondary | ICD-10-CM | POA: Diagnosis not present

## 2023-05-10 DIAGNOSIS — Z7902 Long term (current) use of antithrombotics/antiplatelets: Secondary | ICD-10-CM | POA: Diagnosis not present

## 2023-05-10 DIAGNOSIS — Z7984 Long term (current) use of oral hypoglycemic drugs: Secondary | ICD-10-CM | POA: Insufficient documentation

## 2023-05-10 DIAGNOSIS — Z79899 Other long term (current) drug therapy: Secondary | ICD-10-CM | POA: Diagnosis not present

## 2023-05-10 DIAGNOSIS — Z5982 Transportation insecurity: Secondary | ICD-10-CM | POA: Insufficient documentation

## 2023-05-10 DIAGNOSIS — IMO0001 Reserved for inherently not codable concepts without codable children: Secondary | ICD-10-CM

## 2023-05-10 DIAGNOSIS — E785 Hyperlipidemia, unspecified: Secondary | ICD-10-CM | POA: Diagnosis not present

## 2023-05-10 DIAGNOSIS — I255 Ischemic cardiomyopathy: Secondary | ICD-10-CM | POA: Diagnosis not present

## 2023-05-10 DIAGNOSIS — Z7982 Long term (current) use of aspirin: Secondary | ICD-10-CM | POA: Diagnosis not present

## 2023-05-10 DIAGNOSIS — I5022 Chronic systolic (congestive) heart failure: Secondary | ICD-10-CM | POA: Insufficient documentation

## 2023-05-10 DIAGNOSIS — I110 Hypertensive heart disease with heart failure: Secondary | ICD-10-CM | POA: Insufficient documentation

## 2023-05-10 DIAGNOSIS — I10 Essential (primary) hypertension: Secondary | ICD-10-CM

## 2023-05-10 DIAGNOSIS — I3481 Nonrheumatic mitral (valve) annulus calcification: Secondary | ICD-10-CM | POA: Insufficient documentation

## 2023-05-10 DIAGNOSIS — F17200 Nicotine dependence, unspecified, uncomplicated: Secondary | ICD-10-CM

## 2023-05-10 DIAGNOSIS — I77819 Aortic ectasia, unspecified site: Secondary | ICD-10-CM | POA: Diagnosis not present

## 2023-05-10 DIAGNOSIS — F17210 Nicotine dependence, cigarettes, uncomplicated: Secondary | ICD-10-CM | POA: Diagnosis not present

## 2023-05-10 DIAGNOSIS — I2510 Atherosclerotic heart disease of native coronary artery without angina pectoris: Secondary | ICD-10-CM

## 2023-05-10 LAB — LIPID PANEL
Cholesterol: 254 mg/dL — ABNORMAL HIGH (ref 0–200)
HDL: 61 mg/dL (ref >40)
LDL Cholesterol: 170 mg/dL — ABNORMAL HIGH (ref 0–99)
Total CHOL/HDL Ratio: 4.2 ratio
Triglycerides: 114 mg/dL (ref <150)
VLDL: 23 mg/dL (ref 0–40)

## 2023-05-10 LAB — BASIC METABOLIC PANEL WITH GFR
Anion gap: 8 (ref 5–15)
BUN: 16 mg/dL (ref 8–23)
CO2: 23 mmol/L (ref 22–32)
Calcium: 9.9 mg/dL (ref 8.9–10.3)
Chloride: 111 mmol/L (ref 98–111)
Creatinine, Ser: 1.13 mg/dL — ABNORMAL HIGH (ref 0.44–1.00)
GFR, Estimated: 52 mL/min — ABNORMAL LOW (ref >60)
Glucose, Bld: 115 mg/dL — ABNORMAL HIGH (ref 70–99)
Potassium: 3.9 mmol/L (ref 3.5–5.1)
Sodium: 142 mmol/L (ref 135–145)

## 2023-05-10 LAB — ECHOCARDIOGRAM COMPLETE
AR max vel: 2.18 cm2
AV Area VTI: 1.95 cm2
AV Area mean vel: 1.89 cm2
AV Mean grad: 2 mmHg
AV Peak grad: 3.6 mmHg
Ao pk vel: 0.95 m/s
Area-P 1/2: 4.26 cm2
Calc EF: 44.2 %
MV VTI: 2.56 cm2
S' Lateral: 2.9 cm
Single Plane A2C EF: 47.4 %
Single Plane A4C EF: 43.1 %

## 2023-05-10 NOTE — Patient Instructions (Addendum)
 Thank you for coming in today  If you had labs drawn today, any labs that are abnormal the clinic will call you No news is good news  You have been referred to  lipid clinic, their office will call you for further appointment details .   Medications: No changes  Follow up appointments:  Your physician recommends that you schedule a follow-up appointment in:  6 months With Dr. Shirlee Latch  Please call our office to schedule the follow-up appointment in July 2025.    Do the following things EVERYDAY: Weigh yourself in the morning before breakfast. Write it down and keep it in a log. Take your medicines as prescribed Eat low salt foods--Limit salt (sodium) to 2000 mg per day.  Stay as active as you can everyday Limit all fluids for the day to less than 2 liters   At the [facility], you and your health needs are our priority. As part of our continuing mission to provide you with exceptional heart care, we have created designated Provider Care Teams. These Care Teams include your primary Cardiologist (physician) and Advanced Practice Providers (APPs- Physician Assistants and Nurse Practitioners) who all work together to provide you with the care you need, when you need it.   You may see any of the following providers on your designated Care Team at your next follow up: Dr Arvilla Meres Dr Marca Ancona Dr. Marcos Eke, NP Robbie Lis, [facility] [place], [place] Brynda Peon, NP Karle Plumber, PharmD   Please be sure to bring in all your medications bottles to every appointment.    Thank you for choosing [facility]  If you have any questions or concerns before your next appointment please send [place] a message through [place] or call our office at [phone].    TO LEAVE A MESSAGE FOR THE NURSE SELECT OPTION 2, PLEASE LEAVE A MESSAGE INCLUDING: YOUR NAME DATE OF BIRTH CALL BACK  NUMBER REASON FOR CALL**this is important as we prioritize the call backs  YOU WILL RECEIVE A CALL BACK THE SAME DAY AS LONG AS YOU CALL BEFORE 4:00 PM

## 2023-05-10 NOTE — Progress Notes (Signed)
  Echocardiogram 2D Echocardiogram has been performed.  Ocie Doyne RDCS 05/10/2023, 1:45 PM

## 2023-05-10 NOTE — Progress Notes (Signed)
 ADVANCED HF CLINIC NOTE  Primary Care: Gwinda Passe, NP HF Cardiologist: Dr. Shirlee Latch  Reason for Visit: Heart Failure Follow-up HPI: 73 y.o. female with history of HTN and smoking, CAD, and systolic heart failure/iCM.  Admitted 2/24 with anterior STEMI. LHC with 90% left main, occluded LAD, 70% pLCx, 60% pRCA. S/p DES to LM and PTCA extensively in the LAD with diffuse 70% residual proximal LAD stenosis. RHC showed significantly elevated right and left heart filling pressures, requiring IABP. Echo EF 20-25%, normal RV.  She was discharged home with Lifevest at weight of 213 lbs.   Repeat echo in 5/24 showed EF 40-45%, mild LVH, normal RV, IVC normal.    She returns today for heart failure follow up. Overall feeling well. NYHA II. Denies chest pain, dyspnea, fatigue, lower extremity edema, near-syncope, orthopnea, palpitations, dizziness, and abnormal bleeding. Able to perform ADLs. Took 1 dose PRN Lasix 2 days ago, otherwise has not needed. Appetite okay. Sleeps on 3 pillows. Compliant with all medications, sometimes misses them. BP elevated today, however was running late and stated that she had to run to make it to appointment, not usually elevated. Smoking 3 cigarettes/day, still attempting to stop.  Echo today: EF 40-45% with G1DD, normal RV, IVC normal  PMH: 1. CAD: Anterior STEMI 2/24. LHC with 90% left main, occluded LAD, 70% pLCx, 60% pRCA.  She had DES to left main and PTCA extensively in the LAD with diffuse 70% residual proximal LAD stenosis.  2. HTN 3. Active smoker 4. Chronic systolic CHF: Ischemic cardiomyopathy.  - RHC (2/24): RA 20, PA 49/32 (40), PCWP 33, CI from swan 2.35 - Echo (2/24): EF 20-25%, no LV thrombus, normal RV, IVC normal, trivial MR - Echo (5/24): EF 40-45%, mild LVH, normal RV, IVC normal  Current Outpatient Medications  Medication Sig Dispense Refill   aspirin EC 81 MG tablet Take 1 tablet (81 mg total) by mouth daily. Swallow whole. 90 tablet 3    BRILINTA 90 MG TABS tablet TAKE 1 TABLET BY MOUTH TWICE  DAILY 180 tablet 3   carvedilol (COREG) 6.25 MG tablet TAKE 1 TABLET BY MOUTH TWICE  DAILY 200 tablet 2   digoxin (LANOXIN) 0.125 MG tablet TAKE ONE-HALF TABLET BY MOUTH  DAILY 45 tablet 3   ENTRESTO 49-51 MG TAKE 1 TABLET BY MOUTH TWICE  DAILY 200 tablet 2   FARXIGA 10 MG TABS tablet TAKE 1 TABLET BY MOUTH DAILY 100 tablet 2   furosemide (LASIX) 20 MG tablet TAKE 1 TABLET BY MOUTH DAILY AS  NEEDED FOR 3 LB WEIGHT GAIN  OVERNIGHT OR 5 LB WEIGHT GAIN IN 1 WEEK AS DIRECTED 100 tablet 2   nitroGLYCERIN (NITROSTAT) 0.4 MG SL tablet Place 1 tablet (0.4 mg total) under the tongue every 5 (five) minutes x 3 doses as needed for chest pain. 25 tablet 12   potassium chloride SA (KLOR-CON M) 20 MEQ tablet TAKE 1 TABLET BY MOUTH DAILY 100 tablet 2   rosuvastatin (CRESTOR) 20 MG tablet Take 1 tablet (20 mg total) by mouth daily. 30 tablet 5   spironolactone (ALDACTONE) 25 MG tablet TAKE 1 TABLET BY MOUTH DAILY 100 tablet 2   varenicline (CHANTIX) 0.5 MG tablet DAYS 1-3: Take 1 tablet by mouth daily ; DAY 4-7, Take 1 tablet twice daily, Starting DAY 8: Take 2 tablets twice daily. 53 tablet 0   No current facility-administered medications for this visit.   No Known Allergies  Social History   Socioeconomic History  Marital status: Single    Spouse name: Not on file   Number of children: Not on file   Years of education: Not on file   Highest education level: Not on file  Occupational History   Not on file  Tobacco Use   Smoking status: Every Day   Smokeless tobacco: Never   Tobacco comments:    5-7 cigs/day  Vaping Use   Vaping status: Never Used  Substance and Sexual Activity   Alcohol use: Not Currently   Drug use: Not on file   Sexual activity: Not on file  Other Topics Concern   Not on file  Social History Narrative   Not on file   Social Drivers of Health   Financial Resource Strain: Low Risk  (03/26/2022)   Overall Financial  Resource Strain (CARDIA)    Difficulty of Paying Living Expenses: Not very hard  Food Insecurity: No Food Insecurity (03/26/2022)   Hunger Vital Sign    Worried About Running Out of Food in the Last Year: Never true    Ran Out of Food in the Last Year: Never true  Transportation Needs: Unmet Transportation Needs (03/26/2022)   PRAPARE - Transportation    Lack of Transportation (Medical): Yes    Lack of Transportation (Non-Medical): Yes  Physical Activity: Sufficiently Active (08/10/2022)   Exercise Vital Sign    Days of Exercise per Week: 7 days    Minutes of Exercise per Session: 30 min  Stress: Not on file  Social Connections: Not on file  Intimate Partner Violence: Not At Risk (03/26/2022)   Humiliation, Afraid, Rape, and Kick questionnaire    Fear of Current or Ex-Partner: No    Emotionally Abused: No    Physically Abused: No    Sexually Abused: No   No family history on file.  There were no vitals taken for this visit.  Wt Readings from Last 3 Encounters:  11/19/22 91.4 kg (201 lb 6.4 oz)  10/08/22 90.8 kg (200 lb 3.2 oz)  08/10/22 89.8 kg (198 lb)   PHYSICAL EXAM: General: Well appearing. No distress on RA Cardiac: JVP flat. S1 and S2 present. No murmurs or rub. Abdomen: Soft, non-tender, non-distended.  Extremities: Warm and dry.  No edema.  Neuro: Alert and oriented x3. Affect pleasant. Moves all extremities without difficulty.  ASSESSMENT & PLAN: 1. CAD: Anterior STEMI (2/24), suspect late presentation given several days of stuttering symptoms. Cath showed 90% left main, occluded LAD, 70% pLCx, 60% pRCA.  She had DES to left main and PTCA extensively in the LAD with diffuse 70% residual proximal LAD stenosis.  No further intervention planned on diffuse LAD disease.  Stable w/ no angina - Continue ASA 81 mg daily  - Continue ticagrelor 90 mg bid  - Intolerant to statins, not taking. Refer to Lipid Clinic.  2. Chronic systolic heart failure:  Ischemic cardiomyopathy.   Echo in 2/24 with EF 20-25%, no LV thrombus, normal RV, IVC normal, trivial MR. Required IABP and inotropes during admission. Repeat echo in 5/24 with EF up to 40-45%, normal RV.  Echo today EF 40-45%, G1DD, nl RV.  - She does not need Lasix. - Continue Coreg 6.25 mg bid - Continue Farxiga 10 mg daily.  - Continue Entresto 49/51 mg BID - Continue spironolactone 25 mg daily.  3. HTN: BP elevated today, but states that it is because she was running late to clinic and had to run back up some stairs, not usually high.  - GDMT per  above   4. Smoking: Smoking 3 cigarettes/day. Still cutting back. - Wants to try chantix again, refilled.  5. HLD: Last LDL 157 - Stopped atorvastatin d/t shoulder pain. Start crestor but took herself off d/t myalgias. - Refer to Lipid clinic for statin intolerance - Lipids today.   Follow up with Dr. Shirlee Latch in  6 months  [place] Olene Godfrey, NP 05/10/2023

## 2023-05-12 ENCOUNTER — Other Ambulatory Visit (HOSPITAL_COMMUNITY): Payer: Self-pay | Admitting: Cardiology

## 2023-05-19 ENCOUNTER — Telehealth (HOSPITAL_COMMUNITY): Payer: Self-pay | Admitting: Cardiology

## 2023-05-19 NOTE — Telephone Encounter (Signed)
 Opened in error Pt called to schedule an appt with lipid clinic Call transferred to [facility]

## 2023-06-10 ENCOUNTER — Other Ambulatory Visit (HOSPITAL_COMMUNITY): Payer: Self-pay | Admitting: Cardiology

## 2023-06-10 DIAGNOSIS — I5022 Chronic systolic (congestive) heart failure: Secondary | ICD-10-CM

## 2023-07-17 ENCOUNTER — Other Ambulatory Visit: Payer: Self-pay | Admitting: Cardiology

## 2023-07-17 DIAGNOSIS — I5022 Chronic systolic (congestive) heart failure: Secondary | ICD-10-CM

## 2023-07-21 MED ORDER — ASPIRIN 81 MG PO TBEC: 81.0000 mg | DELAYED_RELEASE_TABLET | Freq: Every day | ORAL | 3 refills | Status: AC

## 2023-07-21 MED ORDER — TICAGRELOR 90 MG PO TABS
90.0000 mg | ORAL_TABLET | Freq: Two times a day (BID) | ORAL | 3 refills | Status: AC
Start: 2023-07-21 — End: ?

## 2023-07-21 MED ORDER — CARVEDILOL 6.25 MG PO TABS: 6.2500 mg | ORAL_TABLET | Freq: Two times a day (BID) | ORAL | 2 refills | Status: AC

## 2023-07-21 MED ORDER — FUROSEMIDE 20 MG PO TABS: 20.0000 mg | ORAL_TABLET | Freq: Every day | ORAL | 2 refills | Status: AC | PRN

## 2023-07-21 MED ORDER — ROSUVASTATIN CALCIUM 20 MG PO TABS: 20.0000 mg | ORAL_TABLET | Freq: Every day | ORAL | 2 refills | Status: DC

## 2023-07-21 MED ORDER — DAPAGLIFLOZIN PROPANEDIOL 10 MG PO TABS: 10.0000 mg | ORAL_TABLET | Freq: Every day | ORAL | 2 refills | Status: AC

## 2023-07-21 MED ORDER — ENTRESTO 49-51 MG PO TABS: 1.0000 | ORAL_TABLET | Freq: Two times a day (BID) | ORAL | 2 refills | Status: AC

## 2023-07-21 MED ORDER — SPIRONOLACTONE 25 MG PO TABS: 25.0000 mg | ORAL_TABLET | Freq: Every day | ORAL | 2 refills | Status: AC

## 2023-07-21 MED ORDER — POTASSIUM CHLORIDE CRYS ER 20 MEQ PO TBCR: 20.0000 meq | EXTENDED_RELEASE_TABLET | Freq: Every day | ORAL | 2 refills | Status: AC

## 2023-08-16 ENCOUNTER — Encounter (INDEPENDENT_AMBULATORY_CARE_PROVIDER_SITE_OTHER): Payer: 59

## 2023-08-20 ENCOUNTER — Encounter (INDEPENDENT_AMBULATORY_CARE_PROVIDER_SITE_OTHER)

## 2023-09-27 ENCOUNTER — Institutional Professional Consult (permissible substitution) (HOSPITAL_BASED_OUTPATIENT_CLINIC_OR_DEPARTMENT_OTHER): Admitting: Internal Medicine

## 2023-10-20 ENCOUNTER — Inpatient Hospital Stay (HOSPITAL_COMMUNITY): Admission: RE | Admit: 2023-10-20 | Source: Ambulatory Visit | Admitting: Cardiology

## 2023-10-22 ENCOUNTER — Institutional Professional Consult (permissible substitution) (HOSPITAL_BASED_OUTPATIENT_CLINIC_OR_DEPARTMENT_OTHER): Admitting: Internal Medicine

## 2023-11-16 ENCOUNTER — Encounter (HOSPITAL_BASED_OUTPATIENT_CLINIC_OR_DEPARTMENT_OTHER): Payer: Self-pay | Admitting: Internal Medicine

## 2023-11-16 ENCOUNTER — Ambulatory Visit (INDEPENDENT_AMBULATORY_CARE_PROVIDER_SITE_OTHER): Admitting: Internal Medicine

## 2023-11-16 VITALS — BP 132/84 | HR 96 | Ht 63.0 in | Wt 222.0 lb

## 2023-11-16 DIAGNOSIS — T466X5D Adverse effect of antihyperlipidemic and antiarteriosclerotic drugs, subsequent encounter: Secondary | ICD-10-CM

## 2023-11-16 DIAGNOSIS — I2510 Atherosclerotic heart disease of native coronary artery without angina pectoris: Secondary | ICD-10-CM | POA: Diagnosis not present

## 2023-11-16 DIAGNOSIS — I5022 Chronic systolic (congestive) heart failure: Secondary | ICD-10-CM

## 2023-11-16 DIAGNOSIS — E785 Hyperlipidemia, unspecified: Secondary | ICD-10-CM

## 2023-11-16 DIAGNOSIS — M791 Myalgia, unspecified site: Secondary | ICD-10-CM | POA: Diagnosis not present

## 2023-11-16 DIAGNOSIS — T466X5A Adverse effect of antihyperlipidemic and antiarteriosclerotic drugs, initial encounter: Secondary | ICD-10-CM

## 2023-11-16 DIAGNOSIS — E7841 Elevated Lipoprotein(a): Secondary | ICD-10-CM | POA: Diagnosis not present

## 2023-11-16 NOTE — Progress Notes (Signed)
 LIPID CLINIC CONSULT NOTE  Chief Complaint:  Manage dyslipidemia  Primary Care Physician: Celestia Rosaline SQUIBB, NP  Primary Cardiologist:  None  HPI:  Rebecca Esparza is a 73 y.o. female who is being seen today for the evaluation of dyslipidemia at the request of Lee, [place], NP.  This is a pleasant 73 year old female who is followed by the advanced heart failure clinic with a history of coronary artery disease complicated by anterior STEMI in February 2024 and heart failure with LVEF 20 to 25% requiring balloon pump insertion.  She was found to have multivessel coronary artery disease and underwent stenting to the left main and PTCA in the LAD.  Ultimately her LVEF improved up to 40 to 45%.  Unfortunately she has been intolerant to statins having had severe myalgias and inability to move her arms.  She had previous testing of LP(a) which was elevated at 326 nmol/L.  Recent LDL cholesterol was 170 with total cholesterol 254, triglycerides 114 and HDL 61.  PMHx:  Past Medical History:  Diagnosis Date   Chronic systolic heart failure (HCC)    Coronary artery disease involving native coronary artery of native heart without angina pectoris    Hyperlipidemia    Hypertension    STEMI involving left anterior descending coronary artery (HCC) 03/2022    Past Surgical History:  Procedure Laterality Date   ABDOMINAL HYSTERECTOMY  1973   CORONARY/GRAFT ACUTE MI REVASCULARIZATION N/A 03/20/2022   Procedure: Coronary/Graft Acute MI Revascularization;  Surgeon: Wonda Sharper, MD;  Location: [facility] INVASIVE CV LAB;  Service: Cardiovascular;  Laterality: N/A;   IABP INSERTION N/A 03/20/2022   Procedure: IABP Insertion;  Surgeon: Wonda Sharper, MD;  Location: [facility] INVASIVE CV LAB;  Service: Cardiovascular;  Laterality: N/A;   RIGHT/LEFT HEART CATH AND CORONARY ANGIOGRAPHY N/A 03/20/2022   Procedure: RIGHT/LEFT HEART CATH AND CORONARY ANGIOGRAPHY;  Surgeon: Wonda Sharper, MD;  Location[facility] INVASIVE CV  LAB;  Service: Cardiovascular;  Laterality: N/A;    FAMHx:  Family History  Problem Relation Age of Onset   Hypertension Mother    Arthritis Mother    AAA (abdominal aortic aneurysm) Mother 58   Heart attack Father        cause of death in his late 66s   HIV/AIDS Sister        CAUSE OF DEATH   Stroke Maternal Grandfather    Hypertension Niece    Stroke Maternal Aunt     SOCHx:   reports that she has been smoking cigarettes. She has been exposed to tobacco smoke. She has never used smokeless tobacco. She reports that she does not currently use alcohol . No history on file for drug use.  ALLERGIES:  Allergies  Allergen Reactions   Statins     Severe arm pain, could barely raise her arm    ROS: Pertinent items noted in HPI and remainder of comprehensive ROS otherwise negative.  HOME MEDS: Current Outpatient Medications on File Prior to Visit  Medication Sig Dispense Refill   aspirin  EC 81 MG tablet Take 1 tablet (81 mg total) by mouth daily. Swallow whole. 90 tablet 3   carvedilol  (COREG ) 6.25 MG tablet Take 1 tablet (6.25 mg total) by mouth 2 (two) times daily. 200 tablet 2   dapagliflozin  propanediol (FARXIGA ) 10 MG TABS tablet Take 1 tablet (10 mg total) by mouth daily. 100 tablet 2   furosemide  (LASIX ) 20 MG tablet Take 1 tablet (20 mg total) by mouth daily as needed for fluid. 100 tablet  2   nitroGLYCERIN  (NITROSTAT ) 0.4 MG SL tablet Place 1 tablet (0.4 mg total) under the tongue every 5 (five) minutes x 3 doses as needed for chest pain. 25 tablet 12   potassium chloride  SA (KLOR-CON  M) 20 MEQ tablet Take 1 tablet (20 mEq total) by mouth daily. 100 tablet 2   sacubitril -valsartan  (ENTRESTO ) 49-51 MG Take 1 tablet by mouth 2 (two) times daily. 200 tablet 2   spironolactone  (ALDACTONE ) 25 MG tablet Take 1 tablet (25 mg total) by mouth daily. 100 tablet 2   ticagrelor  (BRILINTA ) 90 MG TABS tablet Take 1 tablet (90 mg total) by mouth 2 (two) times daily. 180 tablet 3    varenicline  (CHANTIX ) 0.5 MG tablet DAYS 1-3: Take 1 tablet by mouth daily ; DAY 4-7, Take 1 tablet twice daily, Starting DAY 8: Take 2 tablets twice daily. 53 tablet 0   No current facility-administered medications on file prior to visit.    LABS/IMAGING: No results found for this or any previous visit (from the past 48 hours). No results found.  LIPID PANEL:    Component Value Date/Time   CHOL 254 (H) 05/10/2023 1508   TRIG 114 05/10/2023 1508   HDL 61 05/10/2023 1508   CHOLHDL 4.2 05/10/2023 1508   VLDL 23 05/10/2023 1508   LDLCALC 170 (H) 05/10/2023 1508    Lipoprotein (a)  Date/Time Value Ref Range Status  03/21/2022 12:50 AM 326.7 (H) <75.0 nmol/L Final    Comment:    (NOTE) **Results verified by repeat testing** Note:  Values greater than or equal to 75.0 nmol/L may       indicate an independent risk factor for CHD,       but must be evaluated with caution when applied       to non-Caucasian populations due to the       influence of genetic factors on Lp(a) across       ethnicities. 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   Rebecca Esparza has a dyslipidemia well above target LDL less than 55.  Unfortunately she could not tolerate statins and she has a high LP(a).  She is a good candidate for a PCSK9 inhibitor will reach out for Repatha prior authorization.  She may need  additional therapies to drive her cholesterol lower but cannot take statins.  She would not be a candidate for any upcoming LP(a) trials at [facility] given her prior intervention.  Plan repeat lipids including NMR and LP(a) on therapy in about 3 to 4 months.  Thanks again for the kind referral.  Vinie KYM Maxcy, MD, [facility], FNLA, FACP  [place]  [facility]  Medical Director of the Advanced Lipid Disorders &  Cardiovascular Risk Reduction Clinic Diplomate of the American Board of Clinical Lipidology Attending Cardiologist  Direct Dial:  [phone]  Fax: [phone]  Website:  [email or web address]  Vinie BROCKS Annalea Alguire 11/16/2023, 2:17 PM

## 2023-11-16 NOTE — Patient Instructions (Signed)
 Medication Instructions:  Dr. Mona has recommended an injectable medication called LEQVIO. This is administered by a health care provider. The frequency of injections is TWO injections given 3 months apart (loading dose) and then every 6 months after that. The injection appointments are at [facility] ([address] [place], [place]  [address]). Once we have the benefits check information, we will reach out to let you know if the medication is covered 100%, if there is a deductible, co-insurance, out-of-pocket max. From there, we will see if you need patient assistance and our team will take care of working on this. Because of the frequency schedule of this medication, your follow up/repeat cholesterol lab work will be about 5-6 months from now.   *If you need a refill on your cardiac medications before your next appointment, please call your pharmacy*  Lab Work: NMR lipoprofile and Lp(a) - in 4-6 months --see lab slips given today  Testing/Procedures: none  Follow-Up: As needed

## 2023-11-18 ENCOUNTER — Inpatient Hospital Stay (HOSPITAL_COMMUNITY): Admission: RE | Admit: 2023-11-18 | Source: Ambulatory Visit | Admitting: Cardiology

## 2023-11-19 ENCOUNTER — Other Ambulatory Visit: Payer: Self-pay | Admitting: Pharmacist Clinician (PhC)/ Clinical Pharmacy Specialist

## 2023-11-19 DIAGNOSIS — E785 Hyperlipidemia, unspecified: Secondary | ICD-10-CM | POA: Insufficient documentation

## 2023-11-23 ENCOUNTER — Other Ambulatory Visit: Payer: Self-pay | Admitting: Family Medicine

## 2023-11-24 NOTE — Progress Notes (Signed)
 Rebecca Esparza                                          MRN: [id number]   11/24/2023   The VBCI Quality Team Specialist reviewed this patient medical record for the purposes of chart review for care gap closure. The following were reviewed: chart review for care gap closure-breast cancer screening and controlling blood pressure.    VBCI Quality Team

## 2023-11-25 ENCOUNTER — Telehealth: Payer: Self-pay | Admitting: Internal Medicine

## 2023-11-25 NOTE — Telephone Encounter (Signed)
 Spoke with patient about Leqvio denial. She expressed concern about possible SE of PCSK9i such as flu-like symptoms. She said she will think about medications. Asked about PO options -- nexletol/nexlizet? Would not get her to LDL goal or lower LPa (advised her of this). Advised will mail literature on all options and also LPa. She said she would call back with her preference on medication.

## 2023-11-25 NOTE — Telephone Encounter (Signed)
-----   Message from Vinie JAYSON Maxcy sent at 11/22/2023  3:50 PM EDT ----- Regarding: RE: Leqvio Denial Thanks .SABRA Ephraim Reichel, insurance requires step therapy - please let the patient know of denial and lets pursue Repatha - I did discuss this option in the office.  Thanks.  -[place] ----- Message ----- From: Jama Schuyler RAMAN, CPhT Sent: 11/22/2023   3:03 PM EDT To: Vinie JAYSON Maxcy, MD; Dagoberto LITTIE Armour, CPhT Subject: Candida Denial                                  Good afternoon!  An authorization for leqvio for this patient was submitted and we received notice that it was denied. Per the denial letter, insurance will cover this medication only if the patient has tried at least one of the preferred medicines called Praluent or Repatha for at least twelve weeks in a row and it did not help, or patient has a specific medical reason why they cannot use the preferred products for their care.  The denial letter has been scanned into the patient's media tab within their chart.

## 2023-12-22 ENCOUNTER — Other Ambulatory Visit (HOSPITAL_COMMUNITY): Payer: Self-pay | Admitting: Cardiology

## 2024-01-10 ENCOUNTER — Encounter (HOSPITAL_COMMUNITY): Admitting: Cardiology

## 2024-01-17 ENCOUNTER — Telehealth (HOSPITAL_COMMUNITY): Payer: Self-pay

## 2024-01-17 NOTE — Progress Notes (Incomplete)
 ADVANCED HF CLINIC NOTE  Primary Care: [name], NP HF Cardiologist: Dr. Rolan  Reason for Visit: Heart Failure Follow-up HPI: 73 y.o. female with history of HTN and smoking, CAD, and systolic heart failure/iCM.  Admitted 2/24 with anterior STEMI. LHC with 90% left main, occluded LAD, 70% pLCx, 60% pRCA. S/p DES to LM and PTCA extensively in the LAD with diffuse 70% residual proximal LAD stenosis. RHC showed significantly elevated right and left heart filling pressures, requiring IABP. Echo EF 20-25%, normal RV.  She was discharged home with Lifevest at weight of 213 lbs.   Repeat echo in 5/24 showed EF 40-45%, mild LVH, normal RV, IVC normal.    She returns today for heart failure follow up. Overall feeling well. NYHA II. Denies chest pain, dyspnea, fatigue, lower extremity edema, near-syncope, orthopnea, palpitations, dizziness, and abnormal bleeding. Able to perform ADLs. Took 1 dose PRN Lasix  2 days ago, otherwise has not needed. Appetite okay. Sleeps on 3 pillows. Compliant with all medications, sometimes misses them. BP elevated today, however was running late and stated that she had to run to make it to appointment, not usually elevated. Smoking 3 cigarettes/day, still attempting to stop.  Echo today: EF 40-45% with G1DD, normal RV, IVC normal  PMH: 1. CAD: Anterior STEMI 2/24. LHC with 90% left main, occluded LAD, 70% pLCx, 60% pRCA.  She had DES to left main and PTCA extensively in the LAD with diffuse 70% residual proximal LAD stenosis.  2. HTN 3. Active smoker 4. Chronic systolic CHF: Ischemic cardiomyopathy.  - RHC (2/24): RA 20, PA 49/32 (40), PCWP 33, CI from swan 2.35 - Echo (2/24): EF 20-25%, no LV thrombus, normal RV, IVC normal, trivial MR - Echo (5/24): EF 40-45%, mild LVH, normal RV, IVC normal  Current Outpatient Medications  Medication Sig Dispense Refill   aspirin  EC 81 MG tablet Take 1 tablet (81 mg total) by mouth daily. Swallow whole. 90 tablet 3    carvedilol  (COREG ) 6.25 MG tablet Take 1 tablet (6.25 mg total) by mouth 2 (two) times daily. 200 tablet 2   dapagliflozin  propanediol (FARXIGA ) 10 MG TABS tablet Take 1 tablet (10 mg total) by mouth daily. 100 tablet 2   digoxin  (LANOXIN ) 0.125 MG tablet Take 0.5 tablets (62.5 mcg total) by mouth daily. PLEASE SCHEDULE APPOINTMENT FOR MORE REFILLS 30 tablet 0   furosemide  (LASIX ) 20 MG tablet Take 1 tablet (20 mg total) by mouth daily as needed for fluid. 100 tablet 2   nitroGLYCERIN  (NITROSTAT ) 0.4 MG SL tablet Place 1 tablet (0.4 mg total) under the tongue every 5 (five) minutes x 3 doses as needed for chest pain. 25 tablet 12   potassium chloride  SA (KLOR-CON  M) 20 MEQ tablet Take 1 tablet (20 mEq total) by mouth daily. 100 tablet 2   sacubitril -valsartan  (ENTRESTO ) 49-51 MG Take 1 tablet by mouth 2 (two) times daily. 200 tablet 2   spironolactone  (ALDACTONE ) 25 MG tablet Take 1 tablet (25 mg total) by mouth daily. 100 tablet 2   ticagrelor  (BRILINTA ) 90 MG TABS tablet Take 1 tablet (90 mg total) by mouth 2 (two) times daily. 180 tablet 3   varenicline  (CHANTIX ) 0.5 MG tablet DAYS 1-3: Take 1 tablet by mouth daily ; DAY 4-7, Take 1 tablet twice daily, Starting DAY 8: Take 2 tablets twice daily. 53 tablet 0   No current facility-administered medications for this visit.   Allergies  Allergen Reactions   Statins     Severe arm pain,  could barely raise her arm    Social History   Socioeconomic History   Marital status: Single    Spouse name: Not on file   Number of children: Not on file   Years of education: Not on file   Highest education level: Not on file  Occupational History   Not on file  Tobacco Use   Smoking status: Every Day    Types: Cigarettes    Passive exposure: Past   Smokeless tobacco: Never   Tobacco comments:    5-7 cigs/day  Vaping Use   Vaping status: Never Used  Substance and Sexual Activity   Alcohol  use: Not Currently    Comment: socially   Drug use: Not  on file   Sexual activity: Not on file  Other Topics Concern   Not on file  Social History Narrative   Not on file   Social Drivers of Health   Financial Resource Strain: Low Risk  (03/26/2022)   Overall Financial Resource Strain (CARDIA)    Difficulty of Paying Living Expenses: Not very hard  Food Insecurity: No Food Insecurity (03/26/2022)   Hunger Vital Sign    Worried About Running Out of Food in the Last Year: Never true    Ran Out of Food in the Last Year: Never true  Transportation Needs: Unmet Transportation Needs (03/26/2022)   PRAPARE - Transportation    Lack of Transportation (Medical): Yes    Lack of Transportation (Non-Medical): Yes  Physical Activity: Sufficiently Active (08/10/2022)   Exercise Vital Sign    Days of Exercise per Week: 7 days    Minutes of Exercise per Session: 30 min  Stress: Not on file  Social Connections: Not on file  Intimate Partner Violence: Not At Risk (03/26/2022)   Humiliation, Afraid, Rape, and Kick questionnaire    Fear of Current or Ex-Partner: No    Emotionally Abused: No    Physically Abused: No    Sexually Abused: No   Family History  Problem Relation Age of Onset   Hypertension Mother    Arthritis Mother    AAA (abdominal aortic aneurysm) Mother 18   Heart attack Father        cause of death in his late 7s   HIV/AIDS Sister        CAUSE OF DEATH   Stroke Maternal Grandfather    Hypertension Niece    Stroke Maternal Aunt     There were no vitals taken for this visit.  Wt Readings from Last 3 Encounters:  11/16/23 100.7 kg (222 lb)  05/10/23 99 kg (218 lb 3.2 oz)  11/19/22 91.4 kg (201 lb 6.4 oz)   PHYSICAL EXAM: General: Well appearing. No distress on RA Cardiac: JVP flat. S1 and S2 present. No murmurs or rub. Abdomen: Soft, non-tender, non-distended.  Extremities: Warm and dry.  No edema.  Neuro: Alert and oriented x3. Affect pleasant. Moves all extremities without difficulty.  ASSESSMENT & PLAN: 1. CAD: Anterior  STEMI (2/24), suspect late presentation given several days of stuttering symptoms. Cath showed 90% left main, occluded LAD, 70% pLCx, 60% pRCA.  She had DES to left main and PTCA extensively in the LAD with diffuse 70% residual proximal LAD stenosis.  No further intervention planned on diffuse LAD disease.  Stable w/ no angina - Continue ASA 81 mg daily  - Continue ticagrelor  90 mg bid  - Intolerant to statins, not taking. Refer to Lipid Clinic.  2. Chronic systolic heart failure:  Ischemic cardiomyopathy.  Echo in 2/24 with EF 20-25%, no LV thrombus, normal RV, IVC normal, trivial MR. Required IABP and inotropes during admission. Repeat echo in 5/24 with EF up to 40-45%, normal RV.  Echo today EF 40-45%, G1DD, nl RV.  - She does not need Lasix . - Continue Coreg  6.25 mg bid - Continue Farxiga  10 mg daily.  - Continue Entresto  49/51 mg BID - Continue spironolactone  25 mg daily.  3. HTN: BP elevated today, but states that it is because she was running late to clinic and had to run back up some stairs, not usually high.  - GDMT per above   4. Smoking: Smoking 3 cigarettes/day. Still cutting back. - Wants to try chantix  again, refilled.  5. HLD: Last LDL 157 - Stopped atorvastatin  d/t shoulder pain. Start crestor  but took herself off d/t myalgias. - Refer to Lipid clinic for statin intolerance - Lipids today.   Follow up with Dr. Rolan in  6 months  [facility], MANUELITA SAILOR, PA-C 01/17/2024

## 2024-01-17 NOTE — Telephone Encounter (Signed)
 Called to confirm/remind patient of their appointment at the [facility] on 01/18/24 1:30.   Appointment:   [x] Confirmed  [] Left mess   [] No answer/No voice mail  [] VM Full/unable to leave message  [] Phone not in service  Patient reminded to bring all medications and/or complete list.  Confirmed patient has transportation. Gave directions, instructed to utilize valet parking.

## 2024-01-18 ENCOUNTER — Ambulatory Visit (HOSPITAL_COMMUNITY): Admission: RE | Admit: 2024-01-18 | Discharge: 2024-01-18 | Disposition: A | Source: Ambulatory Visit

## 2024-01-18 ENCOUNTER — Telehealth (INDEPENDENT_AMBULATORY_CARE_PROVIDER_SITE_OTHER): Payer: Self-pay | Admitting: Primary Care

## 2024-01-18 NOTE — Telephone Encounter (Signed)
 LVM for pt about appt details. If pt does call back please advise

## 2024-01-19 ENCOUNTER — Ambulatory Visit (INDEPENDENT_AMBULATORY_CARE_PROVIDER_SITE_OTHER): Admitting: Primary Care

## 2024-01-22 ENCOUNTER — Other Ambulatory Visit: Payer: Self-pay | Admitting: Family Medicine

## 2024-02-01 ENCOUNTER — Ambulatory Visit (HOSPITAL_COMMUNITY)

## 2024-02-07 ENCOUNTER — Ambulatory Visit (INDEPENDENT_AMBULATORY_CARE_PROVIDER_SITE_OTHER): Admitting: Primary Care

## 2024-02-07 NOTE — Progress Notes (Signed)
 "  ADVANCED HF CLINIC NOTE  Primary Care: Rebecca Bohr, NP HF Cardiologist: Dr. Rolan  Reason for Visit: Heart Failure Follow-up HPI: 73 y.o. female with history of HTN and smoking, CAD, and systolic heart failure/iCM.  Admitted 2/24 with anterior STEMI. LHC with 90% left main, occluded LAD, 70% pLCx, 60% pRCA. S/p DES to LM and PTCA extensively in the LAD with diffuse 70% residual proximal LAD stenosis. RHC showed significantly elevated right and left heart filling pressures, requiring IABP. Echo EF 20-25%, normal RV.  She was discharged home with Lifevest at weight of 213 lbs.   Echo 5/24 EF 40-45%, mild LVH, normal RV, IVC normal.     Echo 3/25 EF 40-45% with G1DD, normal RV, IVC normal  Today she returns for AHF follow up, getting over the flu. Overall feeling ok just tired. Denies palpitations, CP, dizziness, edema, or PND/Orthopnea. No SOB. Appetite ok, watches what she eats. No fever or chills. Does not weight daily at home because she forgets. Taking all medications. Denies ETOH or drug use. Smokes 3 cigarettes a day. Drinks ~5 cups of fluid/day.   PMH: 1. CAD: Anterior STEMI 2/24. LHC with 90% left main, occluded LAD, 70% pLCx, 60% pRCA.  She had DES to left main and PTCA extensively in the LAD with diffuse 70% residual proximal LAD stenosis.  2. HTN 3. Active smoker 4. Chronic systolic CHF: Ischemic cardiomyopathy.  - RHC (2/24): RA 20, PA 49/32 (40), PCWP 33, CI from swan 2.35 - Echo (2/24): EF 20-25%, no LV thrombus, normal RV, IVC normal, trivial MR - Echo (5/24): EF 40-45%, mild LVH, normal RV, IVC normal  Current Outpatient Medications  Medication Sig Dispense Refill   aspirin  EC 81 MG tablet Take 1 tablet (81 mg total) by mouth daily. Swallow whole. 90 tablet 3   carvedilol  (COREG ) 6.25 MG tablet Take 1 tablet (6.25 mg total) by mouth 2 (two) times daily. 200 tablet 2   dapagliflozin  propanediol (FARXIGA ) 10 MG TABS tablet Take 1 tablet (10 mg total) by mouth  daily. 100 tablet 2   digoxin  (LANOXIN ) 0.125 MG tablet TAKE ONE-HALF TABLET BY MOUTH  DAILY PLEASE SCHEDULE  APPOINTMENT FOR MORE REFILLS 30 tablet 1   furosemide  (LASIX ) 20 MG tablet Take 1 tablet (20 mg total) by mouth daily as needed for fluid. 100 tablet 2   nitroGLYCERIN  (NITROSTAT ) 0.4 MG SL tablet Place 1 tablet (0.4 mg total) under the tongue every 5 (five) minutes x 3 doses as needed for chest pain. 25 tablet 12   potassium chloride  SA (KLOR-CON  M) 20 MEQ tablet Take 1 tablet (20 mEq total) by mouth daily. 100 tablet 2   sacubitril -valsartan  (ENTRESTO ) 49-51 MG Take 1 tablet by mouth 2 (two) times daily. 200 tablet 2   spironolactone  (ALDACTONE ) 25 MG tablet Take 1 tablet (25 mg total) by mouth daily. 100 tablet 2   ticagrelor  (BRILINTA ) 90 MG TABS tablet Take 1 tablet (90 mg total) by mouth 2 (two) times daily. 180 tablet 3   varenicline  (CHANTIX ) 0.5 MG tablet DAYS 1-3: Take 1 tablet by mouth daily ; DAY 4-7, Take 1 tablet twice daily, Starting DAY 8: Take 2 tablets twice daily. 53 tablet 3   No current facility-administered medications for this encounter.   Allergies  Allergen Reactions   Statins     Severe arm pain, could barely raise her arm    Social History   Socioeconomic History   Marital status: Single    Spouse name: Not  on file   Number of children: Not on file   Years of education: Not on file   Highest education level: Not on file  Occupational History   Not on file  Tobacco Use   Smoking status: Every Day    Types: Cigarettes    Passive exposure: Past   Smokeless tobacco: Never   Tobacco comments:    5-7 cigs/day  Vaping Use   Vaping status: Never Used  Substance and Sexual Activity   Alcohol  use: Not Currently    Comment: socially   Drug use: Not on file   Sexual activity: Not on file  Other Topics Concern   Not on file  Social History Narrative   Not on file   Social Drivers of Health   Tobacco Use: High Risk (02/14/2024)   Patient History     Smoking Tobacco Use: Every Day    Smokeless Tobacco Use: Never    Passive Exposure: Past  Financial Resource Strain: Low Risk (03/26/2022)   Overall Financial Resource Strain (CARDIA)    Difficulty of Paying Living Expenses: Not very hard  Food Insecurity: No Food Insecurity (03/26/2022)   Hunger Vital Sign    Worried About Running Out of Food in the Last Year: Never true    Ran Out of Food in the Last Year: Never true  Transportation Needs: Unmet Transportation Needs (03/26/2022)   PRAPARE - Transportation    Lack of Transportation (Medical): Yes    Lack of Transportation (Non-Medical): Yes  Physical Activity: Sufficiently Active (08/10/2022)   Exercise Vital Sign    Days of Exercise per Week: 7 days    Minutes of Exercise per Session: 30 min  Stress: Not on file  Social Connections: Not on file  Intimate Partner Violence: Not At Risk (03/26/2022)   Humiliation, Afraid, Rape, and Kick questionnaire    Fear of Current or Ex-Partner: No    Emotionally Abused: No    Physically Abused: No    Sexually Abused: No  Depression (PHQ2-9): Low Risk (08/10/2022)   Depression (PHQ2-9)    PHQ-2 Score: 0  Alcohol  Screen: Not on file  Housing: Low Risk (03/26/2022)   Housing    Last Housing Risk Score: 0  Utilities: Not At Risk (03/26/2022)   AHC Utilities    Threatened with loss of utilities: No  Health Literacy: Not on file   Family History  Problem Relation Age of Onset   Hypertension Mother    Arthritis Mother    AAA (abdominal aortic aneurysm) Mother 37   Heart attack Father        cause of death in his late 22s   HIV/AIDS Sister        CAUSE OF DEATH   Stroke Maternal Grandfather    Hypertension Niece    Stroke Maternal Aunt     BP 126/84   Pulse 82   Ht 5' 3 (1.6 m)   Wt 98.1 kg (216 lb 3.2 oz)   SpO2 96%   BMI 38.30 kg/m   Wt Readings from Last 3 Encounters:  02/14/24 98.1 kg (216 lb 3.2 oz)  11/16/23 100.7 kg (222 lb)  05/10/23 99 kg (218 lb 3.2 oz)   PHYSICAL  EXAM: General:  well appearing.  No respiratory difficulty. Walked into clinic.  Neck: JVD flat.  Cor: Regular rate & rhythm. No murmurs. Lungs: clear, diminished bases Extremities: no edema  Neuro: alert & oriented x 3. Affect pleasant.   ASSESSMENT & PLAN: 1. CAD: Anterior STEMI (  2/24), suspect late presentation given several days of stuttering symptoms. Cath showed 90% left main, occluded LAD, 70% pLCx, 60% pRCA.  She had DES to left main and PTCA extensively in the LAD with diffuse 70% residual proximal LAD stenosis.  No further intervention planned on diffuse LAD disease.  Stable w/ no angina - Continue ASA 81 mg daily  - Continue ticagrelor  90 mg bid  - Intolerant to statins, has been seen in lipid clinic. Plan for repeat lipid panel and LP(a)today  2. Chronic systolic heart failure:  Ischemic cardiomyopathy.  Echo in 2/24 with EF 20-25%, no LV thrombus, normal RV, IVC normal, trivial MR. Required IABP and inotropes during admission. Repeat echo in 5/24 with EF up to 40-45%, normal RV.  Echo 3/25 EF 40-45%, G1DD, nl RV.  - She does not need Lasix . - Continue Coreg  6.25 mg bid - Continue digoxin  0.125 mg daily, unable to check level today as she took this morning.  - Continue Farxiga  10 mg daily.  - Continue Entresto  49/51 mg BID - Continue spironolactone  25 mg daily. - Labs today.   3. HTN:  - Stable today - GDMT per above   4. Smoking: Smoking 3 cigarettes/day. Still trying to cut back. - Wants to try chantix  again, will reorder today with refills.   5. HLD: Last LDL 157 - Stopped atorvastatin  d/t shoulder pain. Started crestor  but took herself off d/t myalgias. - Referred to Lipid clinic for statin intolerance, work up started for Repatha but she does not want to take any injectable medication.  - Lipids today.   Follow up with Dr. Rolan in  6 months.   Rebecca LITTIE Coe, NP 02/14/2024  "

## 2024-02-08 ENCOUNTER — Ambulatory Visit (HOSPITAL_COMMUNITY): Admitting: Cardiology

## 2024-02-11 ENCOUNTER — Telehealth (HOSPITAL_COMMUNITY): Payer: Self-pay

## 2024-02-11 NOTE — Telephone Encounter (Signed)
 Called to confirm/remind patient of their appointment at the [facility] on 02/11/24.   Appointment:   [x] Confirmed  [] Left mess   [] No answer/No voice mail  [] VM Full/unable to leave message  [] Phone not in service  Patient reminded to bring all medications and/or complete list.  Confirmed patient has transportation. Gave directions, instructed to utilize valet parking.   Patient thought appt was at 11:30  advised her to double check her transportation to make sure she makes it on time

## 2024-02-14 ENCOUNTER — Encounter (HOSPITAL_COMMUNITY): Payer: Self-pay

## 2024-02-14 ENCOUNTER — Ambulatory Visit (HOSPITAL_COMMUNITY)
Admission: RE | Admit: 2024-02-14 | Discharge: 2024-02-14 | Disposition: A | Discharge status: Home / Self Care | Source: Ambulatory Visit | Attending: Internal Medicine | Admitting: Internal Medicine

## 2024-02-14 VITALS — BP 126/84 | HR 82 | Ht 63.0 in | Wt 216.2 lb

## 2024-02-14 DIAGNOSIS — I110 Hypertensive heart disease with heart failure: Secondary | ICD-10-CM | POA: Insufficient documentation

## 2024-02-14 DIAGNOSIS — Z7984 Long term (current) use of oral hypoglycemic drugs: Secondary | ICD-10-CM | POA: Diagnosis not present

## 2024-02-14 DIAGNOSIS — I252 Old myocardial infarction: Secondary | ICD-10-CM | POA: Insufficient documentation

## 2024-02-14 DIAGNOSIS — Z7902 Long term (current) use of antithrombotics/antiplatelets: Secondary | ICD-10-CM | POA: Insufficient documentation

## 2024-02-14 DIAGNOSIS — I255 Ischemic cardiomyopathy: Secondary | ICD-10-CM | POA: Diagnosis present

## 2024-02-14 DIAGNOSIS — I10 Essential (primary) hypertension: Secondary | ICD-10-CM | POA: Diagnosis not present

## 2024-02-14 DIAGNOSIS — Z7982 Long term (current) use of aspirin: Secondary | ICD-10-CM | POA: Insufficient documentation

## 2024-02-14 DIAGNOSIS — Z79899 Other long term (current) drug therapy: Secondary | ICD-10-CM | POA: Diagnosis not present

## 2024-02-14 DIAGNOSIS — Z720 Tobacco use: Secondary | ICD-10-CM

## 2024-02-14 DIAGNOSIS — I2510 Atherosclerotic heart disease of native coronary artery without angina pectoris: Secondary | ICD-10-CM | POA: Insufficient documentation

## 2024-02-14 DIAGNOSIS — E782 Mixed hyperlipidemia: Secondary | ICD-10-CM | POA: Diagnosis not present

## 2024-02-14 DIAGNOSIS — Z955 Presence of coronary angioplasty implant and graft: Secondary | ICD-10-CM | POA: Diagnosis not present

## 2024-02-14 DIAGNOSIS — F17210 Nicotine dependence, cigarettes, uncomplicated: Secondary | ICD-10-CM | POA: Diagnosis not present

## 2024-02-14 DIAGNOSIS — I5022 Chronic systolic (congestive) heart failure: Secondary | ICD-10-CM | POA: Diagnosis not present

## 2024-02-14 DIAGNOSIS — E785 Hyperlipidemia, unspecified: Secondary | ICD-10-CM | POA: Insufficient documentation

## 2024-02-14 LAB — LIPID PANEL
Cholesterol: 231 mg/dL — ABNORMAL HIGH (ref 0–200)
HDL: 56 mg/dL
LDL Cholesterol: 155 mg/dL — ABNORMAL HIGH (ref 0–99)
Total CHOL/HDL Ratio: 4.1 ratio
Triglycerides: 103 mg/dL
VLDL: 21 mg/dL (ref 0–40)

## 2024-02-14 LAB — BASIC METABOLIC PANEL WITH GFR
Anion gap: 11 (ref 5–15)
BUN: 9 mg/dL (ref 8–23)
CO2: 22 mmol/L (ref 22–32)
Calcium: 9.6 mg/dL (ref 8.9–10.3)
Chloride: 105 mmol/L (ref 98–111)
Creatinine, Ser: 1 mg/dL (ref 0.44–1.00)
GFR, Estimated: 60 mL/min — ABNORMAL LOW
Glucose, Bld: 113 mg/dL — ABNORMAL HIGH (ref 70–99)
Potassium: 4.2 mmol/L (ref 3.5–5.1)
Sodium: 139 mmol/L (ref 135–145)

## 2024-02-14 MED ORDER — VARENICLINE TARTRATE 0.5 MG PO TABS: ORAL_TABLET | ORAL | 3 refills | Status: AC

## 2024-02-14 NOTE — Patient Instructions (Addendum)
 Thank you for coming in today  If you had labs drawn today, any labs that are abnormal the clinic will call you No news is good news  Medications: Chantix  starter pack   Follow up appointments:  Your physician recommends that you schedule a follow-up appointment in:  6 months With Dr. Cherrie Please call our office to schedule the follow-up appointment in May 2026 for July 2026.    Do the following things EVERYDAY: Weigh yourself in the morning before breakfast. Write it down and keep it in a log. Take your medicines as prescribed Eat low salt foods--Limit salt (sodium) to 2000 mg per day.  Stay as active as you can everyday Limit all fluids for the day to less than 2 liters   At the [facility], you and your health needs are our priority. As part of our continuing mission to provide you with exceptional heart care, we have created designated Provider Care Teams. These Care Teams include your primary Cardiologist (physician) and Advanced Practice Providers (APPs- Physician Assistants and Nurse Practitioners) who all work together to provide you with the care you need, when you need it.   You may see any of the following providers on your designated Care Team at your next follow up: Dr Toribio Cherrie Dr Ezra Shuck Dr. Ria Gardenia Greig Lenetta, NP Caffie Shed, [place] [facility] [place], [place] Beckey Coe, NP Tinnie Redman, PharmD   Please be sure to bring in all your medications bottles to every appointment.    Thank you for choosing [facility]  If you have any questions or concerns before your next appointment please send us  a message through [place] or call our office at [phone].    TO LEAVE A MESSAGE FOR THE NURSE SELECT OPTION 2, PLEASE LEAVE A MESSAGE INCLUDING: YOUR NAME DATE OF BIRTH CALL BACK NUMBER REASON FOR CALL**this is important as we prioritize the call backs  YOU WILL  RECEIVE A CALL BACK THE SAME DAY AS LONG AS YOU CALL BEFORE 4:00 PM

## 2024-02-17 ENCOUNTER — Ambulatory Visit (HOSPITAL_COMMUNITY): Payer: Self-pay | Admitting: Internal Medicine
